# Patient Record
Sex: Female | Born: 1965 | Race: White | Hispanic: No | Marital: Married | Smoking: Never smoker
Health system: Southern US, Community
[De-identification: ages and names within clinical notes are randomized; demographics above are authoritative.]

## PROBLEM LIST (undated history)

## (undated) DIAGNOSIS — G43909 Migraine, unspecified, not intractable, without status migrainosus: Secondary | ICD-10-CM

## (undated) DIAGNOSIS — R569 Unspecified convulsions: Secondary | ICD-10-CM

## (undated) DIAGNOSIS — G40309 Generalized idiopathic epilepsy and epileptic syndromes, not intractable, without status epilepticus: Principal | ICD-10-CM

## (undated) HISTORY — DX: Migraine, unspecified, not intractable, without status migrainosus: G43.909

## (undated) HISTORY — DX: Generalized idiopathic epilepsy and epileptic syndromes, not intractable, without status epilepticus: G40.309

---
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
## 1999-05-11 ENCOUNTER — Other Ambulatory Visit: Admission: RE | Admit: 1999-05-11 | Discharge: 1999-05-11 | Payer: Self-pay | Admitting: Obstetrics & Gynecology

## 1999-05-13 ENCOUNTER — Ambulatory Visit (HOSPITAL_COMMUNITY): Admission: AD | Admit: 1999-05-13 | Discharge: 1999-05-13 | Payer: Self-pay | Admitting: Obstetrics & Gynecology

## 1999-10-12 ENCOUNTER — Other Ambulatory Visit: Admission: RE | Admit: 1999-10-12 | Discharge: 1999-10-12 | Payer: Self-pay | Admitting: Obstetrics & Gynecology

## 2000-01-03 ENCOUNTER — Inpatient Hospital Stay (HOSPITAL_COMMUNITY): Admission: AD | Admit: 2000-01-03 | Discharge: 2000-01-03 | Payer: Self-pay | Admitting: Obstetrics and Gynecology

## 2000-05-01 ENCOUNTER — Inpatient Hospital Stay (HOSPITAL_COMMUNITY): Admission: AD | Admit: 2000-05-01 | Discharge: 2000-05-03 | Payer: Self-pay | Admitting: Obstetrics & Gynecology

## 2000-06-06 ENCOUNTER — Other Ambulatory Visit: Admission: RE | Admit: 2000-06-06 | Discharge: 2000-06-06 | Payer: Self-pay | Admitting: Obstetrics & Gynecology

## 2001-10-03 ENCOUNTER — Other Ambulatory Visit: Admission: RE | Admit: 2001-10-03 | Discharge: 2001-10-03 | Payer: Self-pay | Admitting: Obstetrics & Gynecology

## 2003-01-23 ENCOUNTER — Emergency Department (HOSPITAL_COMMUNITY): Admission: EM | Admit: 2003-01-23 | Discharge: 2003-01-23 | Payer: Self-pay | Admitting: Emergency Medicine

## 2003-01-23 ENCOUNTER — Encounter: Payer: Self-pay | Admitting: Emergency Medicine

## 2003-09-17 ENCOUNTER — Other Ambulatory Visit: Admission: RE | Admit: 2003-09-17 | Discharge: 2003-09-17 | Payer: Self-pay | Admitting: Family Medicine

## 2010-11-29 HISTORY — PX: FOOT SURGERY: SHX648

## 2010-12-11 ENCOUNTER — Ambulatory Visit
Admission: RE | Admit: 2010-12-11 | Discharge: 2010-12-11 | Payer: Self-pay | Source: Home / Self Care | Attending: Orthopedic Surgery | Admitting: Orthopedic Surgery

## 2010-12-14 LAB — POCT HEMOGLOBIN-HEMACUE: Hemoglobin: 14.6 g/dL (ref 12.0–15.0)

## 2013-06-21 ENCOUNTER — Other Ambulatory Visit: Payer: Self-pay | Admitting: Neurology

## 2013-06-27 ENCOUNTER — Encounter: Payer: Self-pay | Admitting: Nurse Practitioner

## 2013-07-05 ENCOUNTER — Ambulatory Visit: Payer: Self-pay | Admitting: Nurse Practitioner

## 2013-07-31 ENCOUNTER — Other Ambulatory Visit: Payer: Self-pay | Admitting: Neurology

## 2013-08-08 ENCOUNTER — Other Ambulatory Visit: Payer: Self-pay | Admitting: Neurology

## 2013-09-26 ENCOUNTER — Telehealth: Payer: Self-pay | Admitting: *Deleted

## 2013-09-26 NOTE — Telephone Encounter (Signed)
Called patient to let her know here appt time was changed from 315 to 245. If the patient can not make this time she is call the office back and r/s.

## 2013-09-27 ENCOUNTER — Ambulatory Visit: Payer: Self-pay | Admitting: Nurse Practitioner

## 2013-10-26 ENCOUNTER — Emergency Department (HOSPITAL_COMMUNITY)
Admission: EM | Admit: 2013-10-26 | Discharge: 2013-10-26 | Disposition: A | Discharge status: Home / Self Care | Payer: BC Managed Care – PPO | Attending: Emergency Medicine | Admitting: Emergency Medicine

## 2013-10-26 ENCOUNTER — Encounter (HOSPITAL_COMMUNITY): Payer: Self-pay | Admitting: Emergency Medicine

## 2013-10-26 DIAGNOSIS — Y9389 Activity, other specified: Secondary | ICD-10-CM | POA: Insufficient documentation

## 2013-10-26 DIAGNOSIS — G40909 Epilepsy, unspecified, not intractable, without status epilepticus: Secondary | ICD-10-CM | POA: Insufficient documentation

## 2013-10-26 DIAGNOSIS — Z79899 Other long term (current) drug therapy: Secondary | ICD-10-CM | POA: Insufficient documentation

## 2013-10-26 DIAGNOSIS — Y9289 Other specified places as the place of occurrence of the external cause: Secondary | ICD-10-CM | POA: Insufficient documentation

## 2013-10-26 DIAGNOSIS — W503XXA Accidental bite by another person, initial encounter: Secondary | ICD-10-CM | POA: Insufficient documentation

## 2013-10-26 DIAGNOSIS — G43909 Migraine, unspecified, not intractable, without status migrainosus: Secondary | ICD-10-CM | POA: Insufficient documentation

## 2013-10-26 DIAGNOSIS — R569 Unspecified convulsions: Secondary | ICD-10-CM

## 2013-10-26 DIAGNOSIS — Z880 Allergy status to penicillin: Secondary | ICD-10-CM | POA: Insufficient documentation

## 2013-10-26 DIAGNOSIS — S0993XA Unspecified injury of face, initial encounter: Secondary | ICD-10-CM | POA: Insufficient documentation

## 2013-10-26 HISTORY — DX: Unspecified convulsions: R56.9

## 2013-10-26 LAB — COMPREHENSIVE METABOLIC PANEL
ALT: 10 U/L (ref 0–35)
AST: 14 U/L (ref 0–37)
Albumin: 3.8 g/dL (ref 3.5–5.2)
Alkaline Phosphatase: 77 U/L (ref 39–117)
BUN: 13 mg/dL (ref 6–23)
CO2: 29 mEq/L (ref 19–32)
Calcium: 8.7 mg/dL (ref 8.4–10.5)
Chloride: 102 mEq/L (ref 96–112)
Creatinine, Ser: 0.78 mg/dL (ref 0.50–1.10)
GFR calc Af Amer: >90 mL/min (ref >90)
GFR calc non Af Amer: >90 mL/min (ref >90)
Glucose, Bld: 103 mg/dL — ABNORMAL HIGH (ref 70–99)
Potassium: 4.5 mEq/L (ref 3.5–5.1)
Sodium: 138 mEq/L (ref 135–145)
Total Bilirubin: 0.2 mg/dL — ABNORMAL LOW (ref 0.3–1.2)
Total Protein: 6.6 g/dL (ref 6.0–8.3)

## 2013-10-26 LAB — CARBAMAZEPINE LEVEL, TOTAL: Carbamazepine Lvl: 3.8 ug/mL — ABNORMAL LOW (ref 4.0–12.0)

## 2013-10-26 MED ORDER — CARBAMAZEPINE ER 200 MG PO CP12: 400.0000 mg | ORAL_CAPSULE | Freq: Two times a day (BID) | ORAL | Status: DC

## 2013-10-26 MED ORDER — SODIUM CHLORIDE 0.9 % IV SOLN
INTRAVENOUS | Status: DC
  Administered 2013-10-26: 13:00:00 via INTRAVENOUS

## 2013-10-26 NOTE — ED Notes (Signed)
Per family, states history of seizure-had seizure around 1145 lasted about 4-5 minutes-was seen by EMS-family member felt he could transport her-las seizure was 10 years ago

## 2013-10-26 NOTE — ED Provider Notes (Signed)
CSN: [id number]     Arrival date & time 10/26/13  1214 History   First MD Initiated Contact with Patient 10/26/13 1238     Chief Complaint  Patient presents with  . Seizures   (Consider location/radiation/quality/duration/timing/severity/associated sxs/prior Treatment) Patient is a 47 y.o. female presenting with seizures. The history is provided by the patient.  Seizures  patient here after having a seizure witnessed by her husband was lasted for approximately for 5 minutes. Described as generalized tonic-clonic activity with a postictal state. She does have tongue injury denies any loss of bladder function. Last seizure was approximately 10 years ago she denies any recent illnesses. No headache or fever or neck pain. Has been compliant with her Tegretol. States however that she had decreased sleep last night because she went to the black Thursday shopping at [employer]  Past Medical History  Diagnosis Date  . Migraine   . Seizures    Past Surgical History  Procedure Laterality Date  . Eye surgery      age 26  . Foot surgery      11/2010   Family History  Problem Relation Age of Onset  . Cancer Mother     breast   . Cancer - Prostate Maternal Grandfather   . Cancer Paternal Grandfather     bladder   . Stroke      one grandmother   . High blood pressure      one grandmother    History  Substance Use Topics  . Smoking status: Never Smoker   . Smokeless tobacco: Never Used  . Alcohol Use: 1 - 1.5 oz/week    2-3 drink(s) per week     Comment: mixed drinks weekly    OB History   Grav Para Term Preterm Abortions TAB SAB Ect Mult Living                 Review of Systems  Neurological: Positive for seizures.  All other systems reviewed and are negative.    Allergies  Penicillins  Home Medications   Current Outpatient Rx  Name  Route  Sig  Dispense  Refill  . carbamazepine (CARBATROL) 200 MG 12 hr capsule      TAKE 2 CAPSULES BY MOUTH TWICE A DAY   120 capsule  1   . SUMAtriptan (IMITREX) 100 MG tablet   Oral   Take 100 mg by mouth every 2 (two) hours as needed for migraine (onset of migraine).          BP 108/64  Pulse 60  Temp(Src) 98.2 F (36.8 C) (Oral)  Resp 16  SpO2 95%  LMP 08/26/2013 Physical Exam  Nursing note and vitals reviewed. Constitutional: She is oriented to person, place, and time. She appears well-developed and well-nourished.  Non-toxic appearance. No distress.  HENT:  Head: Normocephalic and atraumatic.  Eyes: Conjunctivae, EOM and lids are normal. Pupils are equal, round, and reactive to light.  Neck: Normal range of motion. Neck supple. No tracheal deviation present. No mass present.  Cardiovascular: Normal rate, regular rhythm and normal heart sounds.  Exam reveals no gallop.   No murmur heard. Pulmonary/Chest: Effort normal and breath sounds normal. No stridor. No respiratory distress. She has no decreased breath sounds. She has no wheezes. She has no rhonchi. She has no rales.  Abdominal: Soft. Normal appearance and bowel sounds are normal. She exhibits no distension. There is no tenderness. There is no rebound and no CVA tenderness.  Musculoskeletal: Normal range of  motion. She exhibits no edema and no tenderness.  Neurological: She is alert and oriented to person, place, and time. She has normal strength. No cranial nerve deficit or sensory deficit. GCS eye subscore is 4. GCS verbal subscore is 5. GCS motor subscore is 6.  Skin: Skin is warm and dry. No abrasion and no rash noted.  Psychiatric: She has a normal mood and affect. Her speech is normal and behavior is normal.    ED Course  Procedures (including critical care time) Labs Review Labs Reviewed  CARBAMAZEPINE LEVEL, TOTAL  COMPREHENSIVE METABOLIC PANEL   Imaging Review No results found.  EKG Interpretation   None       MDM  No diagnosis found. Patient instructed to take an extra dose of Tegretol when she gets home. i think that the  siezure was from lack of sleep    Toy Baker, MD 10/26/13 1513

## 2013-10-26 NOTE — ED Notes (Signed)
Is currently alert, oriented x 4

## 2013-10-26 NOTE — ED Notes (Signed)
Reports grand mal seizure w/o aura, no tongue injury, no incontinence,  takes med regularly, denied HA, Visual difficulty, sob, pain, or nausea

## 2013-10-29 ENCOUNTER — Telehealth: Payer: Self-pay | Admitting: Neurology

## 2013-10-30 NOTE — Telephone Encounter (Signed)
Called patient to sched appt but  was unable to reach at both numbers

## 2013-10-30 NOTE — Telephone Encounter (Signed)
Patient called back and sched appt 11/01/13

## 2013-11-01 ENCOUNTER — Encounter: Payer: Self-pay | Admitting: Neurology

## 2013-11-01 ENCOUNTER — Ambulatory Visit (INDEPENDENT_AMBULATORY_CARE_PROVIDER_SITE_OTHER): Payer: BC Managed Care – PPO | Admitting: Neurology

## 2013-11-01 VITALS — BP 121/80 | HR 66 | Ht 71.0 in | Wt 173.0 lb

## 2013-11-01 DIAGNOSIS — G40309 Generalized idiopathic epilepsy and epileptic syndromes, not intractable, without status epilepticus: Secondary | ICD-10-CM

## 2013-11-01 HISTORY — DX: Generalized idiopathic epilepsy and epileptic syndromes, not intractable, without status epilepticus: G40.309

## 2013-11-01 MED ORDER — CARBAMAZEPINE ER 200 MG PO CP12: ORAL_CAPSULE | ORAL | Status: DC

## 2013-11-01 MED ORDER — FOLIC ACID 1 MG PO TABS: 1.0000 mg | ORAL_TABLET | Freq: Every day | ORAL | Status: DC

## 2013-11-01 NOTE — Patient Instructions (Signed)
Seizure, Adult A seizure is abnormal electrical activity in the brain. Seizures can cause a change in attention or behavior (altered mental status). Seizures often involve uncontrollable shaking (convulsions). Seizures usually last from 30 seconds to 2 minutes. Epilepsy is a brain disorder in which a patient has repeated seizures over time. CAUSES  There are many different problems that can cause seizures. In some cases, no cause is found. Common causes of seizures include:  Head injuries.  Brain tumors.  Infections.  Imbalance of chemicals in the blood.  Kidney failure or liver failure.  Heart disease.  Drug abuse.  Stroke.  Withdrawal from certain drugs or alcohol.  Birth defects.  Malfunction of a neurosurgical device placed in the brain. SYMPTOMS  Symptoms vary depending on the part of the brain that is involved. Right before a seizure, you may have a warning (aura) that a seizure is about to occur. An aura may include the following symptoms:   Fear or anxiety.  Nausea.  Feeling like the room is spinning (vertigo).  Vision changes, such as seeing flashing lights or spots. Common symptoms during a seizure include:  Convulsions.  Drooling.  Rapid eye movements.  Grunting.  Loss of bladder and bowel control.  Bitter taste in the mouth. After a seizure, you may feel confused and sleepy. You may also have an injury resulting from convulsions during the seizure. DIAGNOSIS  Your caregiver will perform a physical exam and run some tests to determine the type and cause of your seizure. These tests may include:  Blood tests.  A lumbar puncture test. In this test, a small amount of fluid is removed from the spine and examined.  Electrocardiography (ECG). This test records the electrical activity in your heart.  Imaging tests, such as computed tomography (CT) scans or magnetic resonance imaging (MRI).  Electroencephalography (EEG). This test records the electrical  activity in your brain. TREATMENT  Seizures usually stop on their own. Treatment will depend on the cause of your seizure. In some cases, medicine may be given to prevent future seizures. HOME CARE INSTRUCTIONS   If you are given medicines, take them exactly as prescribed by your caregiver.  Keep all follow-up appointments as directed by your caregiver.  Do not swim or drive until your caregiver says it is okay.  Teach friends and family what to do if you have a seizure. They should:  Lay you on the ground to prevent a fall.  Put a cushion under your head.  Loosen any tight clothing around your neck.  Turn you on your side. If vomiting occurs, this helps keep your airway clear.  Stay with you until you recover. SEEK IMMEDIATE MEDICAL CARE IF:  The seizure lasts longer than 2 to 5 minutes.  The seizure is severe or the person does not wake up after the seizure.  The person has altered mental status. Drive the person to the emergency department or call your local emergency services (911 in [place] MAKE SURE YOU:  Understand these instructions.  Will watch your condition.  Will get help right away if you are not doing well or get worse. Document Released: 11/12/2000 Document Revised: 02/07/2012 Document Reviewed: 11/03/2011 [employer] Patient Information 2014 [employer].  

## 2013-11-01 NOTE — Progress Notes (Signed)
Reason for visit: Seizures  Amber Sanchez is an 47 y.o. female  History of present illness:  Amber Sanchez is a 47 year old left-handed white female with a history of seizures. The patient indicates that she first had a seizure around age 36. The patient has done quite well over time with the seizures, with the last seizure occurring about 10 years ago. The patient has been on a stable dose of Carbatrol taking 400 mg twice daily. The patient has been tolerating the medication well. The patient indicates that she suffered a seizure recently on 10/26/2013. The patient had been up late the night before shopping. The patient got only about 4 or 5 hours of sleep. The patient had no warning with the seizure, and had a generalized convulsive event associated with tongue biting. The patient did not injure herself with the seizure. The patient did not miss any doses of medications, and the blood levels of carbamazepine in the emergency room were slightly subtherapeutic at 3.8. The medical records from the emergency room and the lab reports were reviewed online. The patient comes to this office for an evaluation.  Past Medical History  Diagnosis Date  . Migraine   . Seizures   . 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 Eye surgery      age 72  . Foot surgery      11/2010 left foot stress fracture    Family History  Problem Relation Age of Onset  . Cancer Mother     breast   . Cancer - Prostate Maternal Grandfather   . Cancer Paternal Grandfather     bladder   . Stroke      one grandmother   . High blood pressure      one grandmother   . Seizures Neg Hx     Social history:  reports that she has never smoked. She has never used smokeless tobacco. She reports that she drinks about 1.0 ounces of alcohol per week. She reports that she does not use illicit drugs.    Allergies  Allergen  Reactions  . Penicillins     childhood    Medications:  Current Outpatient Prescriptions on File Prior to Visit  Medication Sig Dispense Refill  . SUMAtriptan (IMITREX) 100 MG tablet Take 100 mg by mouth every 2 (two) hours as needed for migraine (onset of migraine).       No current facility-administered medications on file prior to visit.    ROS:  Out of a complete 14 system review of symptoms, the patient complains only of the following symptoms, and all other reviewed systems are negative.  Headache Seizure Anxiety, not enough sleep  Blood pressure 121/80, pulse 66, height 5\' 11"  (1.803 m), weight 173 lb (78.472 kg), last menstrual period 08/26/2013.  Physical Exam  General: The patient is alert and cooperative at the time of the examination.  Skin: No significant peripheral edema is noted.   Neurologic Exam  Mental status: The patient is oriented x 3.  Cranial nerves: Facial symmetry is present. Speech is normal, no aphasia or dysarthria is noted. Extraocular movements are full. Visual fields are full.  Motor: The patient has good strength in all 4 extremities.  Sensory examination: Soft touch sensation on the face, arms, and legs is symmetric.  Coordination: The patient has good finger-nose-finger and heel-to-shin bilaterally.  Gait and station: The patient has a normal gait.  Tandem gait is normal. Romberg is negative. No drift is seen.  Reflexes: Deep tendon reflexes are symmetric.   Assessment/Plan:  1. History of seizure disorder with recent recurrence  2. History of migraine headache  The patient has been doing quite well with the seizures, although her carbamazepine level was subtherapeutic in the emergency room. The patient may have seizures secondary to sleep deprivation. The patient will have her carbamazepine dose readjusted, taking 400 mg in the morning, and 600 mg in the evening. The patient will contact me if she has any further problems. I have  instructed her that she is not to drive for at least 6 months. The patient will followup through this office in 6 months. The patient will be placed on folic acid, 1 mg daily. The patient will go on vitamin D supplementation, 1000 international units daily.  Amber Palau MD 11/01/2013 9:20 PM  [facility] [address] [place], [place] [address]  Phone [phone] Fax [phone]

## 2013-11-06 ENCOUNTER — Telehealth: Payer: Self-pay | Admitting: *Deleted

## 2013-11-06 NOTE — Telephone Encounter (Signed)
Patient was seen by Dr. Anne Hahn and has a follow-up scheduled with cm in 6 months.

## 2013-11-06 NOTE — Telephone Encounter (Signed)
Message copied by Ardeth Sportsman on Tue Nov 06, 2013  1:25 PM ------      Message from: Beverely Low      Created: Thu Sep 27, 2013 10:59 AM      Regarding: RE: appt        How about 11/4 next Tuesday, my last patient is a 2:30 you can block the 3 and I will see her at 3:30      ----- Message -----         From: Chester Holstein         Sent: 09/27/2013   8:30 AM           To: Nilda Riggs, NP      Subject: appt                                                     This patient that was scheduled for 330 today but we moved her up to 3 pm can not make it. 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 She wants to r/s her appt for today now because she has made other arrangements with her babysitter. Is there any other day that you would be willing to see her on a Mon or Thurs at 330. I will call her back with some options. Please let me know, thanks.        ------

## 2014-03-28 ENCOUNTER — Other Ambulatory Visit: Payer: Self-pay | Admitting: Gastroenterology

## 2014-03-28 DIAGNOSIS — R1032 Left lower quadrant pain: Secondary | ICD-10-CM

## 2014-04-04 ENCOUNTER — Other Ambulatory Visit: Payer: BC Managed Care – PPO

## 2014-04-09 ENCOUNTER — Encounter (HOSPITAL_COMMUNITY): Payer: Self-pay | Admitting: Emergency Medicine

## 2014-04-09 ENCOUNTER — Emergency Department (HOSPITAL_COMMUNITY): Payer: BC Managed Care – PPO

## 2014-04-09 ENCOUNTER — Emergency Department (HOSPITAL_COMMUNITY)
Admission: EM | Admit: 2014-04-09 | Discharge: 2014-04-10 | Disposition: A | Discharge status: Home / Self Care | Payer: BC Managed Care – PPO | Attending: Emergency Medicine | Admitting: Emergency Medicine

## 2014-04-09 DIAGNOSIS — R350 Frequency of micturition: Secondary | ICD-10-CM | POA: Insufficient documentation

## 2014-04-09 DIAGNOSIS — G43909 Migraine, unspecified, not intractable, without status migrainosus: Secondary | ICD-10-CM | POA: Insufficient documentation

## 2014-04-09 DIAGNOSIS — G40909 Epilepsy, unspecified, not intractable, without status epilepticus: Secondary | ICD-10-CM | POA: Insufficient documentation

## 2014-04-09 DIAGNOSIS — Z880 Allergy status to penicillin: Secondary | ICD-10-CM | POA: Insufficient documentation

## 2014-04-09 DIAGNOSIS — N83201 Unspecified ovarian cyst, right side: Secondary | ICD-10-CM

## 2014-04-09 DIAGNOSIS — N83209 Unspecified ovarian cyst, unspecified side: Secondary | ICD-10-CM | POA: Insufficient documentation

## 2014-04-09 LAB — COMPREHENSIVE METABOLIC PANEL
ALT: 9 U/L (ref 0–35)
AST: 14 U/L (ref 0–37)
Albumin: 3.6 g/dL (ref 3.5–5.2)
Alkaline Phosphatase: 72 U/L (ref 39–117)
BUN: 16 mg/dL (ref 6–23)
CO2: 28 mEq/L (ref 19–32)
Calcium: 8.7 mg/dL (ref 8.4–10.5)
Chloride: 102 mEq/L (ref 96–112)
Creatinine, Ser: 0.66 mg/dL (ref 0.50–1.10)
GFR calc Af Amer: >90 mL/min (ref >90)
GFR calc non Af Amer: >90 mL/min (ref >90)
Glucose, Bld: 108 mg/dL — ABNORMAL HIGH (ref 70–99)
Potassium: 3.8 mEq/L (ref 3.7–5.3)
Sodium: 140 mEq/L (ref 137–147)
Total Bilirubin: <0.2 mg/dL — ABNORMAL LOW (ref 0.3–1.2)
Total Protein: 6.5 g/dL (ref 6.0–8.3)

## 2014-04-09 LAB — CBC WITH DIFFERENTIAL/PLATELET
Basophils Absolute: 0 10*3/uL (ref 0.0–0.1)
Basophils Relative: 1 % (ref 0–1)
Eosinophils Absolute: 0.1 10*3/uL (ref 0.0–0.7)
Eosinophils Relative: 2 % (ref 0–5)
HCT: 38.6 % (ref 36.0–46.0)
Hemoglobin: 13 g/dL (ref 12.0–15.0)
Lymphocytes Relative: 33 % (ref 12–46)
Lymphs Abs: 2.9 10*3/uL (ref 0.7–4.0)
MCH: 30.7 pg (ref 26.0–34.0)
MCHC: 33.7 g/dL (ref 30.0–36.0)
MCV: 91.3 fL (ref 78.0–100.0)
Monocytes Absolute: 0.8 10*3/uL (ref 0.1–1.0)
Monocytes Relative: 9 % (ref 3–12)
Neutro Abs: 4.9 10*3/uL (ref 1.7–7.7)
Neutrophils Relative %: 56 % (ref 43–77)
Platelets: 191 10*3/uL (ref 150–400)
RBC: 4.23 MIL/uL (ref 3.87–5.11)
RDW: 12.6 % (ref 11.5–15.5)
WBC: 8.8 10*3/uL (ref 4.0–10.5)

## 2014-04-09 LAB — URINALYSIS, ROUTINE W REFLEX MICROSCOPIC
Bilirubin Urine: NEGATIVE
Glucose, UA: NEGATIVE mg/dL
Hgb urine dipstick: NEGATIVE
Ketones, ur: NEGATIVE mg/dL
Leukocytes, UA: NEGATIVE
Nitrite: NEGATIVE
Protein, ur: NEGATIVE mg/dL
Specific Gravity, Urine: 1.027 (ref 1.005–1.030)
Urobilinogen, UA: 0.2 mg/dL (ref 0.0–1.0)
pH: 5.5 (ref 5.0–8.0)

## 2014-04-09 LAB — LIPASE, BLOOD: Lipase: 37 U/L (ref 11–59)

## 2014-04-09 MED ORDER — IOHEXOL 300 MG/ML  SOLN
100.0000 mL | Freq: Once | INTRAMUSCULAR | Status: AC | PRN
  Administered 2014-04-09: 100 mL via INTRAVENOUS

## 2014-04-09 MED ORDER — IOHEXOL 300 MG/ML  SOLN
50.0000 mL | Freq: Once | INTRAMUSCULAR | Status: AC | PRN
  Administered 2014-04-09: 50 mL via ORAL

## 2014-04-09 NOTE — ED Notes (Signed)
Patient transported to CT 

## 2014-04-09 NOTE — ED Notes (Signed)
Pt reports low abd pain denies n/v/d.  Pt reports urinary frequency and possibly urinary retention.

## 2014-04-10 MED ORDER — CIPROFLOXACIN HCL 500 MG PO TABS: 500.0000 mg | ORAL_TABLET | Freq: Two times a day (BID) | ORAL | Status: DC

## 2014-04-10 MED ORDER — OXYCODONE-ACETAMINOPHEN 5-325 MG PO TABS
1.0000 | ORAL_TABLET | Freq: Once | ORAL | Status: AC
  Administered 2014-04-10: 1 via ORAL
  Filled 2014-04-10: qty 1

## 2014-04-10 MED ORDER — HYDROCODONE-ACETAMINOPHEN 5-325 MG PO TABS: 1.0000 | ORAL_TABLET | Freq: Four times a day (QID) | ORAL | Status: DC | PRN

## 2014-04-10 NOTE — Discharge Instructions (Signed)
Return here as needed.  Followup with your primary care Dr. for recheck °

## 2014-04-10 NOTE — ED Provider Notes (Signed)
CSN: [id number]     Arrival date & time 04/09/14  2102 History   First MD Initiated Contact with Patient 04/09/14 2209     Chief Complaint  Patient presents with  . Abdominal Pain  . Urinary Frequency     (Consider location/radiation/quality/duration/timing/severity/associated sxs/prior Treatment) HPI Patient presents to the emergency department with bilateral lower abdominal pain.  The patient, states, that she's had some urinary frequency.  Patient, states, that nothing seems to make her condition, better or worse.  The patient denies chest pain, shortness of breath, nausea, vomiting, diarrhea, back pain, fever, weakness, dizziness, or syncope.  Patient did not take any medications prior to arrival.  Patient, states, that her symptoms have been constant since earlier today Past Medical History  Diagnosis Date  . Migraine   . Seizures   . 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 Eye surgery      age 89  . Foot surgery      11/2010 left foot stress fracture   Family History  Problem Relation Age of Onset  . Cancer Mother     breast   . Cancer - Prostate Maternal Grandfather   . Cancer Paternal Grandfather     bladder   . Stroke      one grandmother   . High blood pressure      one grandmother   . Seizures Neg Hx    History  Substance Use Topics  . Smoking status: Never Smoker   . Smokeless tobacco: Never Used  . Alcohol Use: 1.0 - 1.5 oz/week    2-3 drink(s) per week     Comment: mixed drinks weekly    OB History   Grav Para Term Preterm Abortions TAB SAB Ect Mult Living                 Review of Systems  All other systems negative except as documented in the HPI. All pertinent positives and negatives as reviewed in the HPI.  Allergies  Penicillins  Home Medications   Prior to Admission medications   Medication Sig Start Date End Date Taking? Authorizing Provider    carbamazepine (CARBATROL) 200 MG 12 hr capsule Two capsules in the morning and three capsules in the evening 11/01/13  Yes York Spanielharles K Willis, MD  cholecalciferol (VITAMIN D) 1000 UNITS tablet Take 1,000 Units by mouth daily.   Yes Historical Provider, MD  ibuprofen (ADVIL,MOTRIN) 200 MG tablet Take 400 mg by mouth every 6 (six) hours as needed for moderate pain.   Yes Historical Provider, MD  SUMAtriptan (IMITREX) 100 MG tablet Take 100 mg by mouth every 2 (two) hours as needed for migraine (onset of migraine).    Historical Provider, MD   BP 105/56  Pulse 61  Temp(Src) 97.5 F (36.4 C) (Oral)  Resp 16  SpO2 95% Physical Exam  Nursing note reviewed. Constitutional: She is oriented to person, place, and time. She appears well-developed and well-nourished. No distress.  HENT:  Head: Normocephalic and atraumatic.  Mouth/Throat: Oropharynx is clear and moist.  Eyes: Pupils are equal, round, and reactive to light.  Neck: Normal range of motion. Neck supple.  Cardiovascular: Normal rate, regular rhythm and normal heart sounds.  Exam reveals no gallop and no friction rub.   No murmur heard. Pulmonary/Chest: Effort normal and breath sounds normal.  Abdominal: Soft. Normal appearance and bowel sounds are normal. She exhibits no distension. There is  tenderness. There is no rebound and no guarding.    Neurological: She is alert and oriented to person, place, and time.  Skin: Skin is warm.    ED Course  Procedures (including critical care time) Labs Review Labs Reviewed  URINALYSIS, ROUTINE W REFLEX MICROSCOPIC - Abnormal; Notable for the following:    APPearance CLOUDY (*)    All other components within normal limits  COMPREHENSIVE METABOLIC PANEL - Abnormal; Notable for the following:    Glucose, Bld 108 (*)    Total Bilirubin <0.2 (*)    All other components within normal limits  CBC WITH DIFFERENTIAL  LIPASE, BLOOD    Imaging Review Ct Abdomen Pelvis W Contrast  04/10/2014    CLINICAL DATA:  Low abdominal pain and pressure for 2 days. Urinary frequency.  EXAM: CT ABDOMEN AND PELVIS WITH CONTRAST  TECHNIQUE: Multidetector CT imaging of the abdomen and pelvis was performed using the standard protocol following bolus administration of intravenous contrast.  CONTRAST:  50mL OMNIPAQUE IOHEXOL 300 MG/ML SOLN, 100mL OMNIPAQUE IOHEXOL 300 MG/ML SOLN  COMPARISON:  None.  FINDINGS: Mild dependent changes in the lung bases.  The liver, spleen, gallbladder, pancreas, adrenal glands, kidneys, abdominal aorta, inferior vena cava, and retroperitoneal lymph nodes are unremarkable. The stomach and small bowel are not abnormally distended. Stool-filled colon without distention. No free air or free fluid in the abdomen.  Pelvis: The appendix is normal. Uterus is anteverted without enlargement. Left ovary is normal. Cystic structure on the right ovary measuring 5.1 cm maximal diameter. Followup ultrasound in 6-12 weeks is recommended. No free or loculated pelvic fluid collections. Bladder wall is not thickened. No significant lymphadenopathy in the pelvis. No evidence of diverticulitis. Mild degenerative changes in spine. No destructive bone lesions appreciated.  IMPRESSION: Indeterminate cystic structure in the right ovary measuring 5.1 cm maximal diameter. Followup ultrasound in 6-12 weeks is recommended. No acute process otherwise identified in the abdomen or pelvis.   Electronically Signed   By: Burman NievesWilliam  Stevens M.D.   On: 04/10/2014 00:19    Patient will be referred back to her primary care doctor.  There is a right ovarian cystic type area.  The patient is advised to followup here for any worsening in her condition.   Carlyle Dollyhristopher W Hyder Deman, PA-C 04/10/14 [phone]

## 2014-04-11 NOTE — ED Provider Notes (Signed)
Medical screening examination/treatment/procedure(s) were conducted as a shared visit with non-physician practitioner(s) and myself.  I personally evaluated the patient during the encounter.   EKG Interpretation None     No acute abdomen. CT abdomen pelvis reveals a 5.1 cm cystic structure on right ovary. Patient has gynecological followup  Donnetta HutchingBrian Anissia Wessells, MD 04/11/14 [phone]

## 2014-05-06 ENCOUNTER — Ambulatory Visit: Payer: BC Managed Care – PPO | Admitting: Nurse Practitioner

## 2014-05-06 ENCOUNTER — Telehealth: Payer: Self-pay | Admitting: Nurse Practitioner

## 2014-05-06 NOTE — Telephone Encounter (Signed)
No show for appt. 

## 2014-05-19 ENCOUNTER — Emergency Department (HOSPITAL_COMMUNITY)
Admission: EM | Admit: 2014-05-19 | Discharge: 2014-05-19 | Disposition: A | Discharge status: Home / Self Care | Payer: BC Managed Care – PPO | Attending: Emergency Medicine | Admitting: Emergency Medicine

## 2014-05-19 ENCOUNTER — Emergency Department (HOSPITAL_COMMUNITY): Payer: BC Managed Care – PPO

## 2014-05-19 ENCOUNTER — Encounter (HOSPITAL_COMMUNITY): Payer: Self-pay | Admitting: Emergency Medicine

## 2014-05-19 DIAGNOSIS — Z79899 Other long term (current) drug therapy: Secondary | ICD-10-CM | POA: Insufficient documentation

## 2014-05-19 DIAGNOSIS — R569 Unspecified convulsions: Secondary | ICD-10-CM | POA: Insufficient documentation

## 2014-05-19 DIAGNOSIS — K529 Noninfective gastroenteritis and colitis, unspecified: Secondary | ICD-10-CM

## 2014-05-19 DIAGNOSIS — K5190 Ulcerative colitis, unspecified, without complications: Secondary | ICD-10-CM | POA: Insufficient documentation

## 2014-05-19 DIAGNOSIS — Z880 Allergy status to penicillin: Secondary | ICD-10-CM | POA: Insufficient documentation

## 2014-05-19 DIAGNOSIS — Z8669 Personal history of other diseases of the nervous system and sense organs: Secondary | ICD-10-CM | POA: Insufficient documentation

## 2014-05-19 DIAGNOSIS — G40309 Generalized idiopathic epilepsy and epileptic syndromes, not intractable, without status epilepticus: Secondary | ICD-10-CM | POA: Insufficient documentation

## 2014-05-19 LAB — URINE MICROSCOPIC-ADD ON

## 2014-05-19 LAB — URINALYSIS, ROUTINE W REFLEX MICROSCOPIC
Bilirubin Urine: NEGATIVE
Glucose, UA: NEGATIVE mg/dL
Ketones, ur: NEGATIVE mg/dL
Leukocytes, UA: NEGATIVE
Nitrite: NEGATIVE
Protein, ur: NEGATIVE mg/dL
Specific Gravity, Urine: 1.025 (ref 1.005–1.030)
Urobilinogen, UA: 0.2 mg/dL (ref 0.0–1.0)
pH: 5.5 (ref 5.0–8.0)

## 2014-05-19 LAB — POC URINE PREG, ED: Preg Test, Ur: NEGATIVE

## 2014-05-19 MED ORDER — SODIUM CHLORIDE 0.9 % IV BOLUS (SEPSIS)
500.0000 mL | Freq: Once | INTRAVENOUS | Status: AC
  Administered 2014-05-19: 500 mL via INTRAVENOUS

## 2014-05-19 MED ORDER — CIPROFLOXACIN HCL 500 MG PO TABS: 500.0000 mg | ORAL_TABLET | Freq: Two times a day (BID) | ORAL | Status: DC

## 2014-05-19 MED ORDER — ONDANSETRON 4 MG PO TBDP
4.0000 mg | ORAL_TABLET | Freq: Once | ORAL | Status: AC
  Administered 2014-05-19: 4 mg via ORAL
  Filled 2014-05-19: qty 1

## 2014-05-19 MED ORDER — IOHEXOL 300 MG/ML  SOLN
100.0000 mL | Freq: Once | INTRAMUSCULAR | Status: AC | PRN
  Administered 2014-05-19: 100 mL via INTRAVENOUS

## 2014-05-19 MED ORDER — METRONIDAZOLE 500 MG PO TABS: 500.0000 mg | ORAL_TABLET | Freq: Two times a day (BID) | ORAL | Status: DC

## 2014-05-19 MED ORDER — HYDROCODONE-ACETAMINOPHEN 5-325 MG PO TABS: 1.0000 | ORAL_TABLET | Freq: Four times a day (QID) | ORAL | Status: DC | PRN

## 2014-05-19 MED ORDER — HYDROCODONE-ACETAMINOPHEN 5-325 MG PO TABS
2.0000 | ORAL_TABLET | Freq: Once | ORAL | Status: AC
  Administered 2014-05-19: 2 via ORAL
  Filled 2014-05-19: qty 2

## 2014-05-19 NOTE — ED Notes (Signed)
Patient transported to CT 

## 2014-05-19 NOTE — ED Provider Notes (Signed)
CSN: [id number]     Arrival date & time 05/19/14  1546 History   First MD Initiated Contact with Patient 05/19/14 1609     Chief Complaint  Patient presents with  . Abdominal Pain     (Consider location/radiation/quality/duration/timing/severity/associated sxs/prior Treatment) Patient is a 48 y.o. female presenting with abdominal pain. The history is provided by the patient.  Abdominal Pain Pain location:  RLQ and LLQ Pain quality: sharp   Pain radiates to:  Does not radiate Pain severity:  Moderate Onset quality:  Sudden Duration:  12 hours Timing:  Constant Progression:  Worsening Chronicity:  Recurrent Context: not sick contacts and not suspicious food intake   Associated symptoms: diarrhea, nausea and vomiting   Associated symptoms: no chills, no cough, no fever and no shortness of breath     Past Medical History  Diagnosis Date  . Migraine   . Seizures   . 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 Eye surgery      age 64  . Foot surgery      11/2010 left foot stress fracture   Family History  Problem Relation Age of Onset  . Cancer Mother     breast   . Cancer - Prostate Maternal Grandfather   . Cancer Paternal Grandfather     bladder   . Stroke      one grandmother   . High blood pressure      one grandmother   . Seizures Neg Hx    History  Substance Use Topics  . Smoking status: Never Smoker   . Smokeless tobacco: Never Used  . Alcohol Use: 1.0 - 1.5 oz/week    2-3 drink(s) per week     Comment: mixed drinks weekly    OB History   Grav Para Term Preterm Abortions TAB SAB Ect Mult Living                 Review of Systems  Constitutional: Negative for fever and chills.  Respiratory: Negative for cough and shortness of breath.   Gastrointestinal: Positive for nausea, vomiting, abdominal pain and diarrhea.  All other systems reviewed and are  negative.     Allergies  Penicillins  Home Medications   Prior to Admission medications   Medication Sig Start Date End Date Taking? Authorizing Provider  carbamazepine (CARBATROL) 200 MG 12 hr capsule Two capsules in the morning and three capsules in the evening 11/01/13  Yes York Spaniel, MD  cholecalciferol (VITAMIN D) 1000 UNITS tablet Take 1,000 Units by mouth daily.   Yes Historical Provider, MD  HYDROcodone-acetaminophen (NORCO/VICODIN) 5-325 MG per tablet Take 1 tablet by mouth every 6 (six) hours as needed for moderate pain. 04/10/14  Yes Jamesetta Orleans Lawyer, PA-C  ibuprofen (ADVIL,MOTRIN) 200 MG tablet Take 400 mg by mouth every 6 (six) hours as needed for moderate pain.   Yes Historical Provider, MD  SUMAtriptan (IMITREX) 100 MG tablet Take 100 mg by mouth every 2 (two) hours as needed for migraine (onset of migraine).   Yes Historical Provider, MD   BP 128/73  Pulse 66  Temp(Src) 98.1 F (36.7 C) (Oral)  Resp 16  SpO2 99% Physical Exam  Nursing note and vitals reviewed. Constitutional: She is oriented to person, place, and time. She appears well-developed and well-nourished. No distress.  HENT:  Head: Normocephalic and atraumatic.  Eyes: EOM are normal. Pupils are equal, round, and reactive  to light.  Neck: Normal range of motion. Neck supple.  Cardiovascular: Normal rate and regular rhythm.  Exam reveals no friction rub.   No murmur heard. Pulmonary/Chest: Effort normal and breath sounds normal. No respiratory distress. She has no wheezes. She has no rales.  Abdominal: Soft. She exhibits no distension. There is tenderness (lower, bilateral lower quadrant). There is no rebound.  Musculoskeletal: Normal range of motion. She exhibits no edema.  Neurological: She is alert and oriented to person, place, and time. No cranial nerve deficit. She exhibits normal muscle tone. Coordination normal.  Skin: No rash noted. She is not diaphoretic.    ED Course  Procedures  (including critical care time) Labs Review Labs Reviewed  URINALYSIS, ROUTINE W REFLEX MICROSCOPIC - Abnormal; Notable for the following:    Hgb urine dipstick MODERATE (*)    All other components within normal limits  URINE MICROSCOPIC-ADD ON  POC URINE PREG, ED    Imaging Review [place] Transvaginal Non-ob  05/19/2014   CLINICAL DATA:  Abdominal pain. History of ovarian cysts. Evaluate for potential ovarian torsion.  EXAM: TRANSABDOMINAL AND TRANSVAGINAL ULTRASOUND OF PELVIS  TECHNIQUE: Both transabdominal and transvaginal ultrasound examinations of the pelvis were performed. Transabdominal technique was performed for global imaging of the pelvis including uterus, ovaries, adnexal regions, and pelvic cul-de-sac. It was necessary to proceed with endovaginal exam following the transabdominal exam to visualize the endometrium and bilateral adnexal regions.  COMPARISON:  No priors.  CT of the abdomen and pelvis 04/10/2014.  FINDINGS: Uterus  Measurements: 8.8 x 4.0 x 5.5 cm. No fibroids or other mass visualized. Tiny 3 mm anechoic lesion in the region of the cervix, compatible with a tiny nabothian cysts.  Endometrium  Thickness: 3.3 mm.  No focal abnormality visualized.  Right ovary  Could not be visualized secondary to bowel gas.  Left ovary  Could not be visualized secondary to bowel gas.  Other findings  No significant free fluid in the cul-de-sac.  IMPRESSION: 1. Limited study which was unable to visualize either ovary. 2. No free fluid in the cul-de-sac. 3. Tiny nabothian cysts incidentally noted.   Electronically Signed   By: Trudie Reedaniel  Entrikin M.D.   On: 05/19/2014 18:00   [place] Pelvis Complete  05/19/2014   CLINICAL DATA:  Abdominal pain. History of ovarian cysts. Evaluate for potential ovarian torsion.  EXAM: TRANSABDOMINAL AND TRANSVAGINAL ULTRASOUND OF PELVIS  TECHNIQUE: Both transabdominal and transvaginal ultrasound examinations of the pelvis were performed. Transabdominal technique was performed  for global imaging of the pelvis including uterus, ovaries, adnexal regions, and pelvic cul-de-sac. It was necessary to proceed with endovaginal exam following the transabdominal exam to visualize the endometrium and bilateral adnexal regions.  COMPARISON:  No priors.  CT of the abdomen and pelvis 04/10/2014.  FINDINGS: Uterus  Measurements: 8.8 x 4.0 x 5.5 cm. No fibroids or other mass visualized. Tiny 3 mm anechoic lesion in the region of the cervix, compatible with a tiny nabothian cysts.  Endometrium  Thickness: 3.3 mm.  No focal abnormality visualized.  Right ovary  Could not be visualized secondary to bowel gas.  Left ovary  Could not be visualized secondary to bowel gas.  Other findings  No significant free fluid in the cul-de-sac.  IMPRESSION: 1. Limited study which was unable to visualize either ovary. 2. No free fluid in the cul-de-sac. 3. Tiny nabothian cysts incidentally noted.   Electronically Signed   By: Trudie Reedaniel  Entrikin M.D.   On: 05/19/2014 18:00   Ct Abdomen  Pelvis W Contrast  05/19/2014   CLINICAL DATA:  Abdominal pain. Nausea and vomiting. Ovaries could not be visualized on sonography.  EXAM: CT ABDOMEN AND PELVIS WITH CONTRAST  TECHNIQUE: Multidetector CT imaging of the abdomen and pelvis was performed using the standard protocol following bolus administration of intravenous contrast.  CONTRAST:  100mL OMNIPAQUE IOHEXOL 300 MG/ML  SOLN  COMPARISON:  04/09/2014 ; 05/19/2014  FINDINGS: Dependent subsegmental atelectasis in both lower lobes. The liver, spleen, pancreas, and adrenal glands appear unremarkable. No specific gallbladder or biliary abnormality identified. Kidneys and proximal ureters unremarkable. No pathologic upper abdominal adenopathy is observed. No pathologic pelvic adenopathy is observed. Appendix normal. No significant ovarian enlargement or significant ovarian abnormality is apparent on CT; both ovaries appear to have small follicles. Uterus unremarkable.  The dominant  finding on today's exam is considerable abnormal wall thickening extending from the splenic flexure to the proximal sigmoid colon would associated fifth surrounding mesenteric stranding. I do not see inflamed diverticula and pattern is more one of colitis rather than diverticulitis. There is some air-fluid levels in nondilated small bowel in the adjacent left abdomen.  No significant atherosclerotic vascular calcification in the abdomen. No inflammatory findings at the terminal ileum.  IMPRESSION: 1. Considerable abnormal wall thickening of the entire descending colon, with adjacent low-grade mesenteric edema. Infectious colitis is favored. 2. Each ovary has 1-2 small follicles but the ovaries do not demonstrate the enlargement, surrounding edema, or vascular changes characteristic of ovarian infarct or torsion.   Electronically Signed   By: Herbie BaltimoreWalt  Liebkemann M.D.   On: 05/19/2014 21:04     EKG Interpretation None      MDM   Final diagnoses:  Colitis    48 year old female with history of ovarian cysts presents with lower abdominal pain. Sharp, began last night. Radiates across the entire lower abdomen. Consistent with prior cyst pain. Has followup with her OB/GYN, but felt she wanted to come in and get checked out because she was in such pain. Associated nausea, vomiting, diarrhea. No fevers. Has been amenorrheic since last August, but Saturday started having vaginal bleeding. No vaginal discharge. Very mild burning dysuria Here on exam has lower abdominal pain. Her vital signs are stable. Most of her pain is very low, just on either side of the pubic bone. Concern for ovarian cyst pain. Will perform pelvic exam and do vaginal ultrasound. On pelvic, R sided adnexal pain without mass or fullness.  Transvaginal US unable to visualize either ovary due to bowel gas. I spoke to OB/GYN, who recommended CT scan. CT shows colitis, likely source of pain. Patient has f/u in the morning with [facility] Jennette [facility]ettleNeal [facility]. Give pain meds, Cipro/Flagyl. Stable for discharge.  Dagmar HaitWilliam Blair Walden, MD 05/19/14 [phone]

## 2014-05-19 NOTE — ED Notes (Signed)
Pt presents with c/o abdominal pain that started about 11 pm last night. Pt says that her pain is in her lower abdomen, pt has a hx of ovarian cysts. Pt has had N/V/D since the pain started last night. Pt is ambulatory to triage.

## 2014-05-19 NOTE — Discharge Instructions (Signed)

## 2014-11-03 ENCOUNTER — Other Ambulatory Visit: Payer: Self-pay | Admitting: Neurology

## 2014-11-03 NOTE — Telephone Encounter (Signed)
No showed last appt  

## 2014-11-04 NOTE — Telephone Encounter (Signed)
I called the patient.  Got no answer, left message.

## 2014-11-05 NOTE — Telephone Encounter (Signed)
Patient has appt scheduled on 02/24/2015

## 2015-01-14 ENCOUNTER — Telehealth: Payer: Self-pay

## 2015-01-14 NOTE — Telephone Encounter (Signed)
Called and left patient a message to reschedule her apt. Time with Eber JonesCarolyn 02-24-15 at 2:00. CM/Willis. Please when patient calls reschedule her apt. Thanks Annabelle Harmanana.

## 2015-02-24 ENCOUNTER — Ambulatory Visit: Payer: BC Managed Care – PPO | Admitting: Nurse Practitioner

## 2015-04-27 ENCOUNTER — Other Ambulatory Visit: Payer: Self-pay | Admitting: Neurology

## 2015-05-29 ENCOUNTER — Other Ambulatory Visit: Payer: Self-pay | Admitting: Neurology

## 2015-05-29 NOTE — Telephone Encounter (Signed)
Patient has appt scheduled in July  

## 2015-06-11 ENCOUNTER — Ambulatory Visit: Payer: Self-pay | Admitting: Nurse Practitioner

## 2015-07-01 ENCOUNTER — Ambulatory Visit (INDEPENDENT_AMBULATORY_CARE_PROVIDER_SITE_OTHER): Payer: BLUE CROSS/BLUE SHIELD | Admitting: Nurse Practitioner

## 2015-07-01 ENCOUNTER — Encounter: Payer: Self-pay | Admitting: Nurse Practitioner

## 2015-07-01 VITALS — BP 132/84 | HR 56 | Ht 71.0 in | Wt 170.2 lb

## 2015-07-01 DIAGNOSIS — G40309 Generalized idiopathic epilepsy and epileptic syndromes, not intractable, without status epilepticus: Secondary | ICD-10-CM | POA: Diagnosis not present

## 2015-07-01 DIAGNOSIS — Z5181 Encounter for therapeutic drug level monitoring: Secondary | ICD-10-CM

## 2015-07-01 MED ORDER — CARBAMAZEPINE ER 200 MG PO CP12: ORAL_CAPSULE | ORAL | Status: DC

## 2015-07-01 NOTE — Progress Notes (Signed)
I have read the note, and I agree with the clinical assessment and plan.  WILLIS,CHARLES KEITH   

## 2015-07-01 NOTE — Progress Notes (Signed)
[facility]  PATIENT: Amber Sanchez DOB: 24-Oct-1966   REASON FOR VISIT: Follow-up for seizure disorder, migraines HISTORY FROM: Patient    HISTORY OF PRESENT ILLNESS:Ms. Powell is a 49 year old left-handed white female with a history of seizures. She was last seen in this office by Dr. Anne Hahn 11/01/2013. The patient indicates that she first had a seizure around age 7. The patient has done quite well over time with the seizures, with the last seizure 10/26/2013 and prior to that 10 years earlier . The patient has been on  Carbatrol taking 400 mg in the morning and 600 mg at night.  The patient has been tolerating the medication well. Sleep deprivation is a seizure trigger for her and with her last seizure she  had been up late the night before shopping. The patient got only about 4 or 5 hours of sleep. The patient had no warning with the seizure, and had a generalized convulsive event associated with tongue biting. The patient did not injure herself with the seizure. The patient did not miss any doses of medications, and the blood levels of carbamazepine in the emergency room were slightly subtherapeutic at 3.8.   The patient also has a history of migraines but has not had a recent headache and has not had to use her Imitrex in quite some time.The patient comes to this office for an evaluation.    REVIEW OF SYSTEMS: Full 14 system review of systems performed and notable only for those listed, all others are neg:  Constitutional: neg  Cardiovascular: neg Ear/Nose/Throat: neg  Skin: neg Eyes: neg Respiratory: neg Gastroitestinal: neg  Hematology/Lymphatic: neg  Endocrine: neg Musculoskeletal:neg Allergy/Immunology: neg Neurological: neg Psychiatric: neg Sleep : neg   ALLERGIES: Allergies  Allergen Reactions  . Penicillins     Childhood, unknown    HOME MEDICATIONS: Outpatient Prescriptions Prior to Visit  Medication Sig Dispense Refill  .  carbamazepine (CARBATROL) 200 MG 12 hr capsule TAKE 2 CAPSULES BY MOUTH EVERY MORNING & 3 CAPSULES IN THE EVENING 50 capsule 0  . cholecalciferol (VITAMIN D) 1000 UNITS tablet Take 1,000 Units by mouth daily.    Marland Kitchen ibuprofen (ADVIL,MOTRIN) 200 MG tablet Take 400 mg by mouth every 6 (six) hours as needed for moderate pain.    . SUMAtriptan (IMITREX) 100 MG tablet Take 100 mg by mouth every 2 (two) hours as needed for migraine (onset of migraine).    . carbamazepine (CARBATROL) 200 MG 12 hr capsule TAKE 2 CAPSULES BY MOUTH EVERY MORNING & 3 CAPSULES IN THE EVENING 75 capsule 0  . ciprofloxacin (CIPRO) 500 MG tablet Take 1 tablet (500 mg total) by mouth 2 (two) times daily. One po bid x 7 days 14 tablet 0  . HYDROcodone-acetaminophen (NORCO/VICODIN) 5-325 MG per tablet Take 1 tablet by mouth every 6 (six) hours as needed for moderate pain. 15 tablet 0  . HYDROcodone-acetaminophen (NORCO/VICODIN) 5-325 MG per tablet Take 1 tablet by mouth every 6 (six) hours as needed for moderate pain. 20 tablet 0  . metroNIDAZOLE (FLAGYL) 500 MG tablet Take 1 tablet (500 mg total) by mouth 2 (two) times daily. One po bid x 7 days 14 tablet 0   No facility-administered medications prior to visit.    PAST MEDICAL HISTORY: Past Medical History  Diagnosis Date  . Migraine   . Seizures   . 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  Eye surgery      age 67  . Foot surgery      11/2010 left foot stress fracture    FAMILY HISTORY: Family History  Problem Relation Age of Onset  . Cancer Mother     breast   . Cancer - Prostate Maternal Grandfather   . Cancer Paternal Grandfather     bladder   . Stroke      one grandmother   . High blood pressure      one grandmother   . Seizures Neg Hx     SOCIAL HISTORY: History   Social History  . Marital Status: Married    Spouse Name: Loraine Leriche  .  Number of Children: 2  . Years of Education: college   Occupational History  .     Social History Main Topics  . Smoking status: Never Smoker   . Smokeless tobacco: Never Used  . Alcohol Use: 1.0 - 1.5 oz/week    2-3 drink(s) per week     Comment: mixed drinks weekly   . Drug Use: No  . Sexual Activity: Not on file   Other Topics Concern  . Not on file   Social History Narrative   Patient has completed her masters in [employer] in an online course.    Patient works at [employer].    Patient has been married for 18 years and has 2 children. Ages 66 and 71   Patient drinks 3 cups of coffee daily.      PHYSICAL EXAM  Filed Vitals:   07/01/15 0928  BP: 132/84  Pulse: 56  Height:  (1.803 m)  Weight: 170 lb 3.2 oz (77.202 kg)   Body mass index is 23.75 kg/(m^2). General: The patient is alert and cooperative at the time of the examination. Skin: No significant peripheral edema is noted. Neurologic Exam Mental status: The patient is oriented x 3. Cranial nerves: Facial symmetry is present. Speech is normal, no aphasia or dysarthria is noted. Extraocular movements are full. Visual fields are full. Motor: The patient has good strength in all 4 extremities. no focal weakness Sensory examination: Soft touch sensation on the face, arms, and legs is symmetric. Coordination: The patient has good finger-nose-finger and heel-to-shin bilaterally. Gait and station: The patient has a normal gait. Tandem gait is normal. Romberg is negative. No drift is seen. Reflexes: Deep tendon reflexes are symmetric.   DIAGNOSTIC DATA (LABS, IMAGING, TESTING) -   ASSESSMENT AND PLAN  49 y.o. year old female  has a past medical history of Migraine; Seizures; and Generalized convulsive epilepsy without mention of intractable epilepsy (11/01/2013). here  to follow-up. She is currently on Imitrex for her migraine and Carbatrol for her seizure disorder.  Continue Carbatrol at  current dose will refill for one year Check labs today, CBC, CMP and CBZ level Call for any seizure activity Follow-up yearly and when necessary Nilda Riggs, [facility], [facility], APRN  [facility] [address], [address] [place], [place] [address] [phone]

## 2015-07-01 NOTE — Patient Instructions (Signed)
Continue Carbatrol at current dose will refill for one year Check labs today Call for any seizure activity Follow-up yearly and when necessary

## 2015-07-02 ENCOUNTER — Telehealth: Payer: Self-pay | Admitting: *Deleted

## 2015-07-02 LAB — COMPREHENSIVE METABOLIC PANEL
ALT: 10 IU/L (ref 0–32)
AST: 14 IU/L (ref 0–40)
Albumin/Globulin Ratio: 2.1 (ref 1.1–2.5)
Albumin: 4.5 g/dL (ref 3.5–5.5)
Alkaline Phosphatase: 79 IU/L (ref 39–117)
BUN/Creatinine Ratio: 26 — ABNORMAL HIGH (ref 9–23)
BUN: 17 mg/dL (ref 6–24)
Bilirubin Total: 0.3 mg/dL (ref 0.0–1.2)
CALCIUM: 8.8 mg/dL (ref 8.7–10.2)
CO2: 26 mmol/L (ref 18–29)
Chloride: 103 mmol/L (ref 97–108)
Creatinine, Ser: 0.65 mg/dL (ref 0.57–1.00)
GFR calc Af Amer: 121 mL/min/{1.73_m2} (ref >59)
GFR, EST NON AFRICAN AMERICAN: 105 mL/min/{1.73_m2} (ref >59)
GLOBULIN, TOTAL: 2.1 g/dL (ref 1.5–4.5)
Glucose: 92 mg/dL (ref 65–99)
Potassium: 4.6 mmol/L (ref 3.5–5.2)
Sodium: 144 mmol/L (ref 134–144)
TOTAL PROTEIN: 6.6 g/dL (ref 6.0–8.5)

## 2015-07-02 LAB — CBC WITH DIFFERENTIAL/PLATELET
BASOS ABS: 0 10*3/uL (ref 0.0–0.2)
Basos: 1 %
EOS (ABSOLUTE): 0.1 10*3/uL (ref 0.0–0.4)
Eos: 2 %
HEMOGLOBIN: 13.3 g/dL (ref 11.1–15.9)
Hematocrit: 40.6 % (ref 34.0–46.6)
IMMATURE GRANULOCYTES: 0 %
Immature Grans (Abs): 0 10*3/uL (ref 0.0–0.1)
LYMPHS: 47 %
Lymphocytes Absolute: 2.1 10*3/uL (ref 0.7–3.1)
MCH: 29.8 pg (ref 26.6–33.0)
MCHC: 32.8 g/dL (ref 31.5–35.7)
MCV: 91 fL (ref 79–97)
MONOCYTES: 9 %
Monocytes Absolute: 0.4 10*3/uL (ref 0.1–0.9)
NEUTROS ABS: 1.9 10*3/uL (ref 1.4–7.0)
Neutrophils: 41 %
PLATELETS: 170 10*3/uL (ref 150–379)
RBC: 4.47 x10E6/uL (ref 3.77–5.28)
RDW: 13.5 % (ref 12.3–15.4)
WBC: 4.5 10*3/uL (ref 3.4–10.8)

## 2015-07-02 LAB — CARBAMAZEPINE LEVEL, TOTAL: CARBAMAZEPINE LVL: 6.7 ug/mL (ref 4.0–12.0)

## 2015-07-02 NOTE — Telephone Encounter (Signed)
-----   Message from Nilda Riggs, NP sent at 07/02/2015  8:29 AM EDT ----- Labs look good please call the patient

## 2015-07-02 NOTE — Telephone Encounter (Signed)
I called and LMVM for pt on her mobile # to return call for lab results.  ( labs look good).

## 2015-07-03 ENCOUNTER — Encounter: Payer: Self-pay | Admitting: *Deleted

## 2015-07-03 NOTE — Telephone Encounter (Signed)
Patient returned Sandy's phone call. I relayed to patient labs looked good.

## 2015-08-06 ENCOUNTER — Telehealth: Payer: Self-pay | Admitting: *Deleted

## 2015-08-06 NOTE — Telephone Encounter (Signed)
I called pts cell and LMVM for her to return call.  (checking on her, received call from [facility] [address]).

## 2015-08-06 NOTE — Telephone Encounter (Signed)
Events noted. I was not aware that Carbatrol had an immediate release preparation. Carbatrol is a brand name generic, only long-acting preparation.

## 2015-08-06 NOTE — Telephone Encounter (Signed)
Dolphus Jenny, Pharmacist [facility] [phone] calling about pt and medication error that has happened.  Pt picked up new prescription of carbatrol IR  tablets (has been taking 2 tabs in am and 3 tabs in pm).  She should have been given carbatrol  XR capsules. (same dose).  Mr. Ouida Sills caught this on 07-30-15.  Spoke to pt and she has had no seizures , just more drowsiness then her normal.  The medication was filled with new drug, but the pt has not picked up.  (there is note that says returned to stock).  He wanted to let us know.

## 2015-10-04 IMAGING — CT CT ABD-PELV W/ CM
1 of 3 series · 14 of 32 positions shown, 19 images · IV contrast (omnipaque)
Comparison: None.

CLINICAL DATA: Low abdominal pain and pressure for 2 days. Urinary
frequency.

EXAM:
CT ABDOMEN AND PELVIS WITH CONTRAST
TECHNIQUE: Multidetector CT imaging of the abdomen and pelvis was performed
using the standard protocol following bolus administration of
intravenous contrast.
CONTRAST:  50mL OMNIPAQUE IOHEXOL 300 MG/ML SOLN, 100mL OMNIPAQUE
IOHEXOL 300 MG/ML SOLN

[Series 2: abd/pel with · axial · 0.74mm/px · z∈[+971,+1386]mm · 14 of 93 slices shown, 19 images]
[im 5/93  soft-tissue]
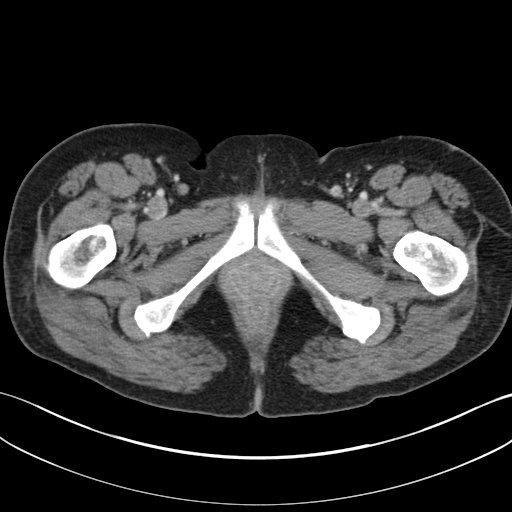
[im 5/93  bone]
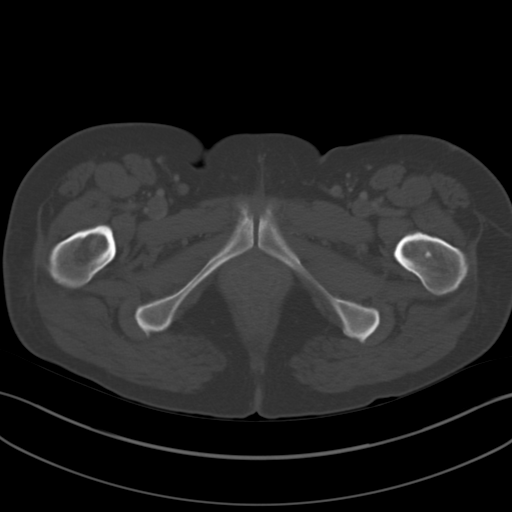
[im 15/93  soft-tissue]
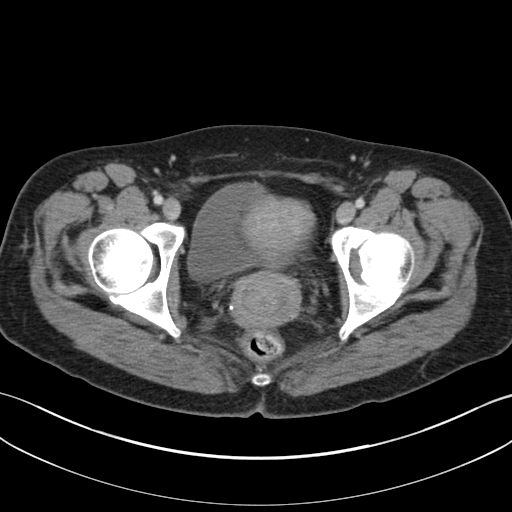
[im 20/93  soft-tissue]
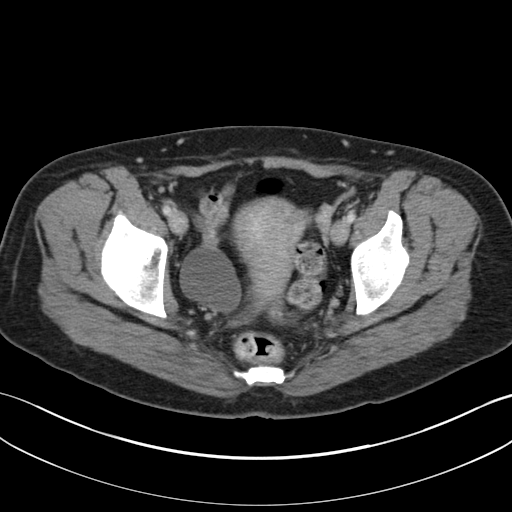
[im 25/93  soft-tissue]
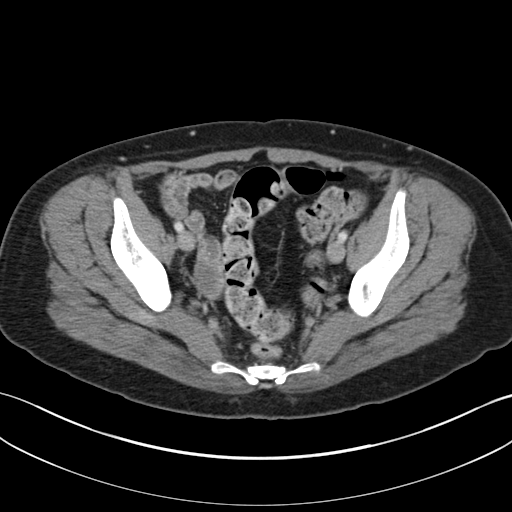
[im 34/93  soft-tissue]
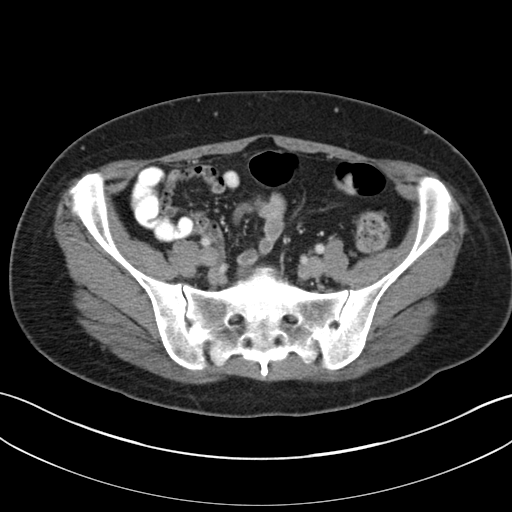
[im 39/93  soft-tissue]
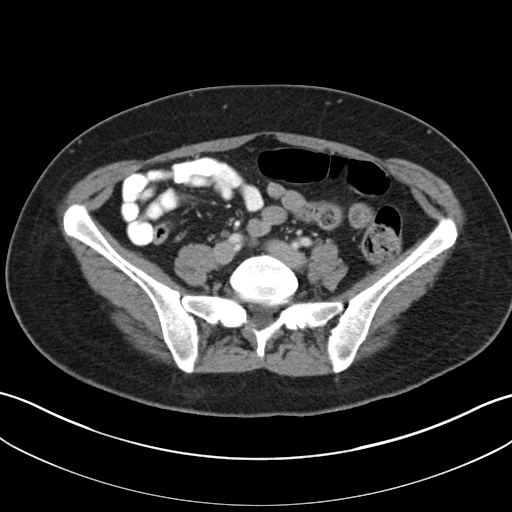
[im 49/93  soft-tissue]
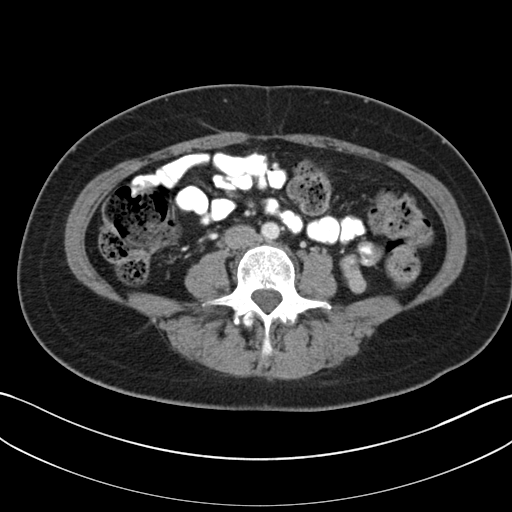
[im 54/93  soft-tissue]
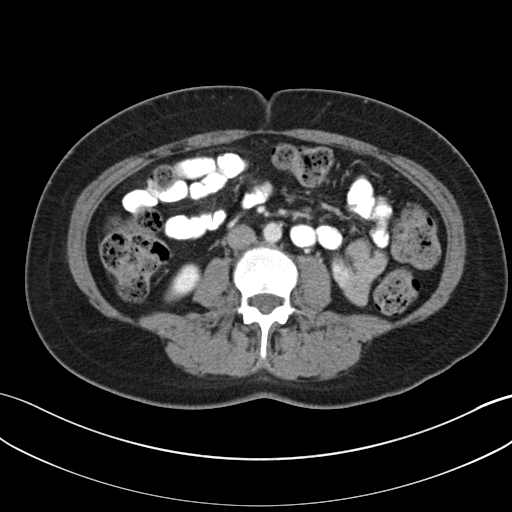
[im 59/93  soft-tissue]
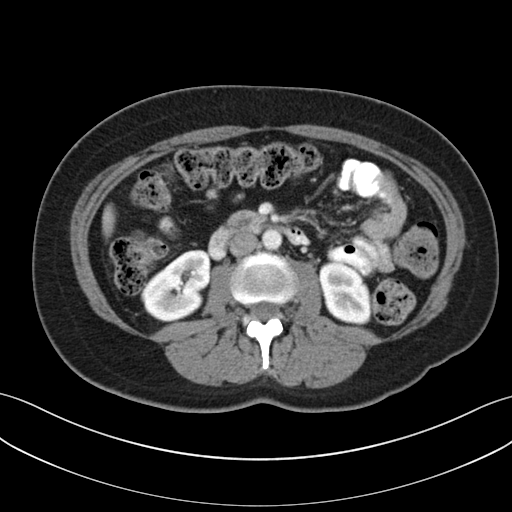
[im 59/93  bone]
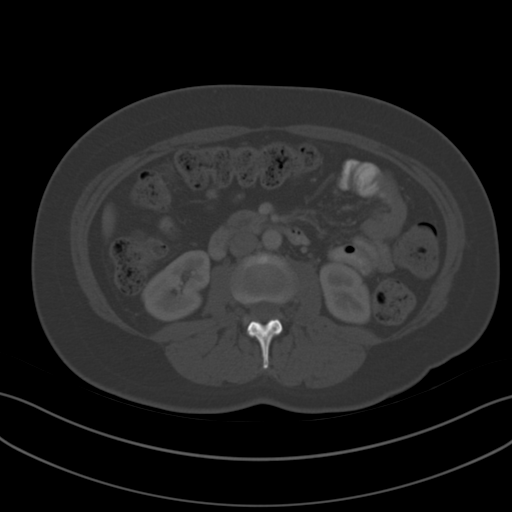
[im 68/93  soft-tissue]
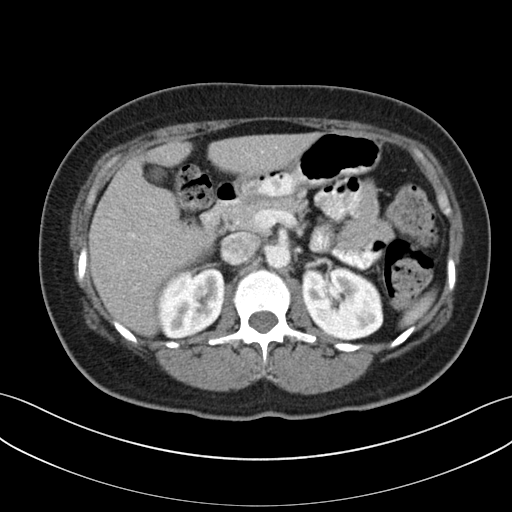
[im 73/93  soft-tissue]
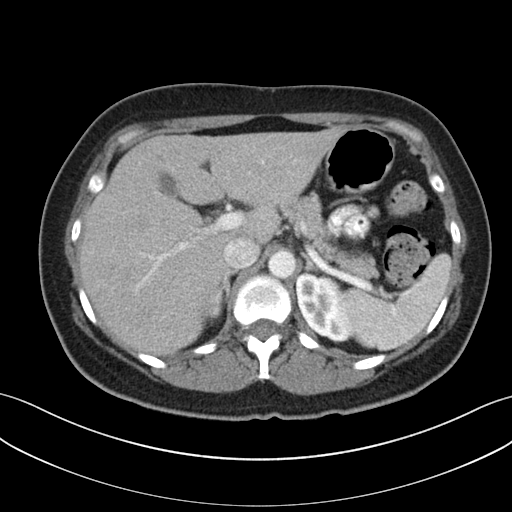
[im 73/93  lung]
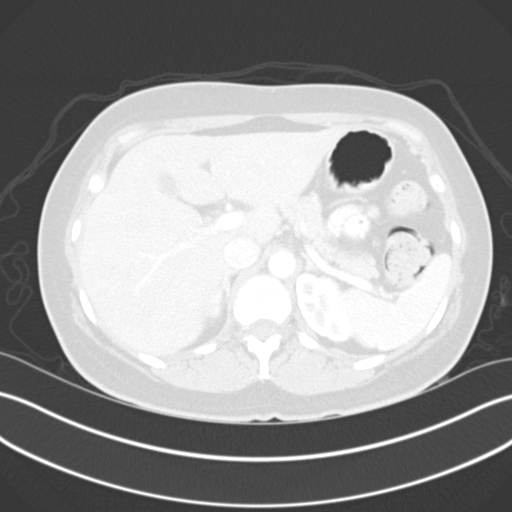
[im 78/93  soft-tissue]
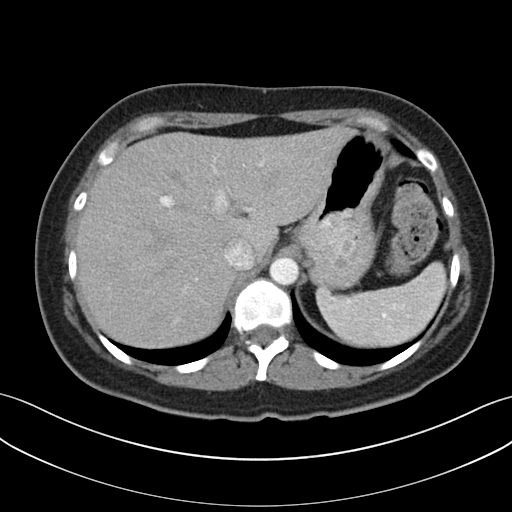
[im 78/93  lung]
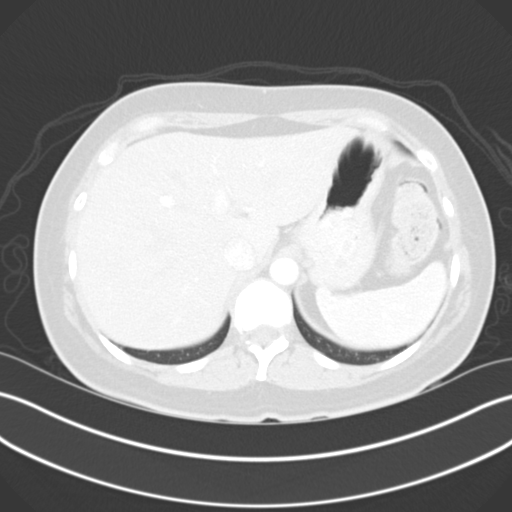
[im 83/93  lung]
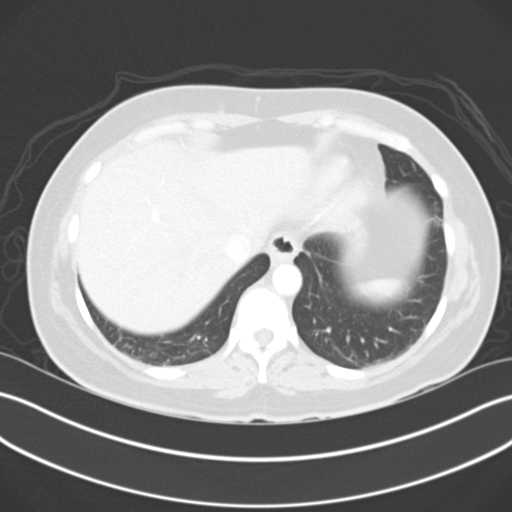
[im 88/93  soft-tissue]
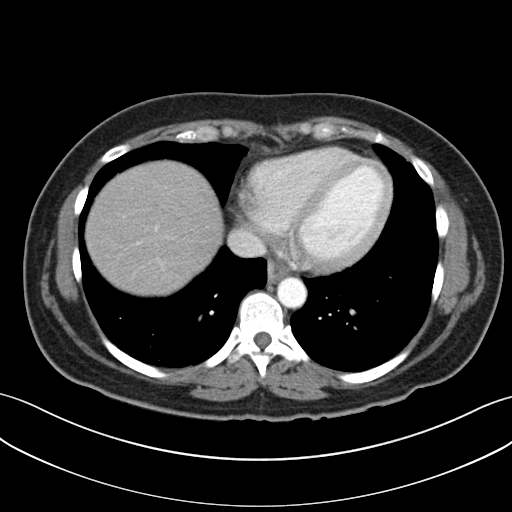
[im 88/93  lung]
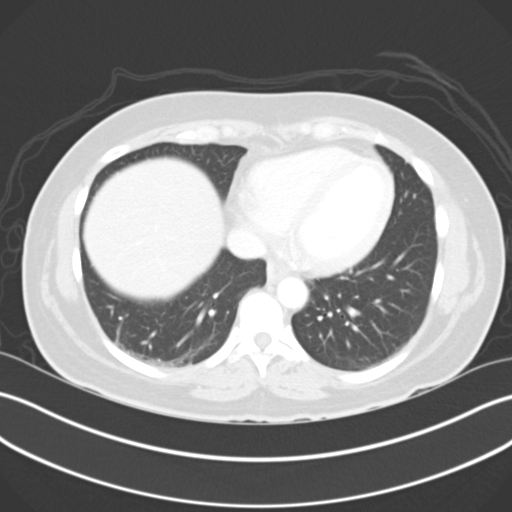

[14 of 32 positions shown; findings below may reference images not displayed]

FINDINGS: Mild dependent changes in the lung bases.

The liver, spleen, gallbladder, pancreas, adrenal glands, kidneys,
abdominal aorta, inferior vena cava, and retroperitoneal lymph nodes
are unremarkable. The stomach and small bowel are not abnormally
distended. Stool-filled colon without distention. No free air or
free fluid in the abdomen.

Pelvis: The appendix is normal. Uterus is anteverted without
enlargement. Left ovary is normal. Cystic structure on the right
ovary measuring 5.1 cm maximal diameter. Followup ultrasound in 6-12
weeks is recommended. No free or loculated pelvic fluid collections.
Bladder wall is not thickened. No significant lymphadenopathy in the
pelvis. No evidence of diverticulitis. Mild degenerative changes in
spine. No destructive bone lesions appreciated.
IMPRESSION: Indeterminate cystic structure in the right ovary measuring 5.1 cm
maximal diameter. Followup ultrasound in 6-12 weeks is recommended.
No acute process otherwise identified in the abdomen or pelvis.

## 2016-06-30 ENCOUNTER — Ambulatory Visit: Payer: BLUE CROSS/BLUE SHIELD | Admitting: Nurse Practitioner

## 2016-08-09 ENCOUNTER — Other Ambulatory Visit: Payer: Self-pay | Admitting: Nurse Practitioner

## 2016-08-30 ENCOUNTER — Other Ambulatory Visit: Payer: Self-pay | Admitting: Nurse Practitioner

## 2016-09-25 ENCOUNTER — Other Ambulatory Visit: Payer: Self-pay | Admitting: Nurse Practitioner

## 2016-09-29 ENCOUNTER — Ambulatory Visit: Payer: BLUE CROSS/BLUE SHIELD | Admitting: Nurse Practitioner

## 2016-10-18 ENCOUNTER — Ambulatory Visit (INDEPENDENT_AMBULATORY_CARE_PROVIDER_SITE_OTHER): Payer: BLUE CROSS/BLUE SHIELD | Admitting: Nurse Practitioner

## 2016-10-18 ENCOUNTER — Encounter: Payer: Self-pay | Admitting: Nurse Practitioner

## 2016-10-18 VITALS — BP 128/70 | HR 69 | Ht 71.0 in | Wt 164.8 lb

## 2016-10-18 DIAGNOSIS — G40309 Generalized idiopathic epilepsy and epileptic syndromes, not intractable, without status epilepticus: Secondary | ICD-10-CM

## 2016-10-18 DIAGNOSIS — G43809 Other migraine, not intractable, without status migrainosus: Secondary | ICD-10-CM

## 2016-10-18 DIAGNOSIS — G43909 Migraine, unspecified, not intractable, without status migrainosus: Secondary | ICD-10-CM | POA: Insufficient documentation

## 2016-10-18 MED ORDER — CARBAMAZEPINE ER 200 MG PO CP12: ORAL_CAPSULE | ORAL | 11 refills | Status: DC

## 2016-10-18 NOTE — Patient Instructions (Signed)
Continue Carbatrol at current dose will refill for one year Check labs today, CBC, CMP and CBZ level Call for any seizure activity Follow-up yearly and when necessary

## 2016-10-18 NOTE — Progress Notes (Signed)
[facility]  PATIENT: Amber Sanchez DOB: 1966-02-26   REASON FOR VISIT: Follow-up for seizure disorder, migraines HISTORY FROM: Patient    HISTORY OF PRESENT ILLNESS:Ms. Amber Sanchez is a 50 year old left-handed white female with a history of seizures.  The patient indicates that she first had a seizure around age 50. The patient has done quite well over time with the seizures, with the last seizure 10/26/2013 and prior to that 10 years earlier . The patient has been on  Carbatrol taking 400 mg in the morning and 600 mg at night.  The patient has been tolerating the medication well. Sleep deprivation is a seizure trigger for her and with her last seizure she  had been up late the night before shopping. The patient got only about 4 or 5 hours of sleep. The patient had no warning with the seizure, and had a generalized convulsive event associated with tongue biting. The patient did not injure herself with the seizure. The patient did not miss any doses of medications, and the blood levels of carbamazepine in the emergency room were slightly subtherapeutic at 3.8.   The patient also has a history of migraines but has not had a recent headache and has not had to use her Imitrex in quite some time.The patient comes to this office for an evaluation.    REVIEW OF SYSTEMS: Full 14 system review of systems performed and notable only for those listed, all others are neg:  Constitutional: neg  Cardiovascular: neg Ear/Nose/Throat: neg  Skin: neg Eyes: neg Respiratory: neg Gastroitestinal: neg  Hematology/Lymphatic: neg  Endocrine: neg Musculoskeletal:neg Allergy/Immunology: neg Neurological: neg Psychiatric: neg Sleep : neg   ALLERGIES: Allergies  Allergen Reactions  . Penicillins     Childhood, unknown    HOME MEDICATIONS: Outpatient Medications Prior to Visit  Medication Sig Dispense Refill  . carbamazepine (CARBATROL) 200 MG 12 hr capsule TAKE 2 CAPSULES BY  MOUTH EVERY MORNING & 3 CAPSULES IN THE EVENING 150 capsule 0  . cholecalciferol (VITAMIN D) 1000 UNITS tablet Take 1,000 Units by mouth daily.    Marland Kitchen. ibuprofen (ADVIL,MOTRIN) 200 MG tablet Take 400 mg by mouth every 6 (six) hours as needed for moderate pain.    . SUMAtriptan (IMITREX) 100 MG tablet Take 100 mg by mouth every 2 (two) hours as needed for migraine (onset of migraine).     No facility-administered medications prior to visit.     PAST MEDICAL HISTORY: Past Medical History:  Diagnosis Date  . 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 Migraine   . Seizures (HCC)     PAST SURGICAL HISTORY: Past Surgical History:  Procedure Laterality Date  . EYE SURGERY     age 89  . FOOT SURGERY     11/2010 left foot stress fracture    FAMILY HISTORY: Family History  Problem Relation Age of Onset  . Cancer Mother     breast   . Cancer - Prostate Maternal Grandfather   . Cancer Paternal Grandfather     bladder   . Stroke      one grandmother   . High blood pressure      one grandmother   . Seizures Neg Hx     SOCIAL HISTORY: Social History   Social History  . Marital status: Married    Spouse name: Amber Sanchez  . Number of children: 2  . Years of education: college   Occupational History  .  Unemployed   Social  History Main Topics  . Smoking status: Never Smoker  . Smokeless tobacco: Never Used  . Alcohol use 1.0 - 1.5 oz/week    2 - 3 drink(s) per week     Comment: mixed drinks weekly   . Drug use: No  . Sexual activity: Not on file   Other Topics Concern  . Not on file   Social History Narrative   Patient has completed her masters in [employer] in an online course.    Patient works at [employer].    Patient has been married for 18 years and has 2 children. Ages 2916 and 3111   Patient drinks 3 cups of coffee daily.      PHYSICAL EXAM  Vitals:   10/18/16 1406  BP: 128/70  Pulse: 69  Weight: 164 lb  12.8 oz (74.8 kg)  Height: 5\' 11"  (1.803 m)   Body mass index is 22.98 kg/m. General: The patient is alert and cooperative at the time of the examination. Skin: No significant peripheral edema is noted. Neurologic Exam Mental status: The patient is oriented x 3. Cranial nerves: Facial symmetry is present. Speech is normal, no aphasia or dysarthria is noted. Extraocular movements are full. Visual fields are full. Motor: The patient has good strength in all 4 extremities. no focal weakness Sensory examination: Soft touch sensation on the face, arms, and legs is symmetric. Coordination: The patient has good finger-nose-finger and heel-to-shin bilaterally. Gait and station: The patient has a normal gait. Tandem gait is normal. Romberg is negative. No drift is seen. Reflexes: Deep tendon reflexes are symmetric.   DIAGNOSTIC DATA (LABS, IMAGING, TESTING) -   ASSESSMENT AND PLAN  50 y.o. year old female  has a past medical history of Migraine; Seizures; and Generalized convulsive epilepsy without mention of intractable epilepsy (11/01/2013). here  to follow-up. She is currently on Imitrex for her migraine and Carbatrol for her seizure disorder.  Continue Carbatrol at current dose will refill for one year Check labs today, CBC, CMP and CBZ level Call for any seizure activity Follow-up yearly and when necessary Amber Sanchez, [facility] HospitalGNP, [facility]BC, APRN  [facility] [address], [address] [place], [place] [address] [phone] Floor on call that in to your pharmacy she has less medication this is considered a muscle relaxer as it is but I do Zoloft for migraine headaches

## 2016-10-19 ENCOUNTER — Telehealth: Payer: Self-pay | Admitting: *Deleted

## 2016-10-19 LAB — CBC WITH DIFFERENTIAL/PLATELET
BASOS: 1 %
Basophils Absolute: 0 10*3/uL (ref 0.0–0.2)
EOS (ABSOLUTE): 0.1 10*3/uL (ref 0.0–0.4)
Eos: 2 %
Hematocrit: 40.7 % (ref 34.0–46.6)
Hemoglobin: 13.5 g/dL (ref 11.1–15.9)
IMMATURE GRANULOCYTES: 0 %
Immature Grans (Abs): 0 10*3/uL (ref 0.0–0.1)
LYMPHS ABS: 2.6 10*3/uL (ref 0.7–3.1)
Lymphs: 43 %
MCH: 30.2 pg (ref 26.6–33.0)
MCHC: 33.2 g/dL (ref 31.5–35.7)
MCV: 91 fL (ref 79–97)
MONOS ABS: 0.5 10*3/uL (ref 0.1–0.9)
Monocytes: 8 %
NEUTROS PCT: 46 %
Neutrophils Absolute: 2.8 10*3/uL (ref 1.4–7.0)
PLATELETS: 208 10*3/uL (ref 150–379)
RBC: 4.47 x10E6/uL (ref 3.77–5.28)
RDW: 13.4 % (ref 12.3–15.4)
WBC: 6 10*3/uL (ref 3.4–10.8)

## 2016-10-19 LAB — CARBAMAZEPINE LEVEL, TOTAL: Carbamazepine (Tegretol), S: 7.7 ug/mL (ref 4.0–12.0)

## 2016-10-19 LAB — COMPREHENSIVE METABOLIC PANEL
ALBUMIN: 4.3 g/dL (ref 3.5–5.5)
ALK PHOS: 80 IU/L (ref 39–117)
ALT: 10 IU/L (ref 0–32)
AST: 15 IU/L (ref 0–40)
Albumin/Globulin Ratio: 1.8 (ref 1.2–2.2)
BUN / CREAT RATIO: 26 — AB (ref 9–23)
BUN: 17 mg/dL (ref 6–24)
Bilirubin Total: 0.2 mg/dL (ref 0.0–1.2)
CO2: 26 mmol/L (ref 18–29)
CREATININE: 0.65 mg/dL (ref 0.57–1.00)
Calcium: 9 mg/dL (ref 8.7–10.2)
Chloride: 101 mmol/L (ref 96–106)
GFR calc Af Amer: 120 mL/min/{1.73_m2} (ref >59)
GFR, EST NON AFRICAN AMERICAN: 104 mL/min/{1.73_m2} (ref >59)
GLOBULIN, TOTAL: 2.4 g/dL (ref 1.5–4.5)
Glucose: 96 mg/dL (ref 65–99)
POTASSIUM: 4.7 mmol/L (ref 3.5–5.2)
SODIUM: 144 mmol/L (ref 134–144)
Total Protein: 6.7 g/dL (ref 6.0–8.5)

## 2016-10-19 NOTE — Telephone Encounter (Signed)
Per Enid Skeens Martin, NP, LVM informing patient her labs look good. Left name, number for any questions.

## 2016-11-14 ENCOUNTER — Emergency Department (HOSPITAL_COMMUNITY)
Admission: EM | Admit: 2016-11-14 | Discharge: 2016-11-14 | Disposition: A | Discharge status: Home / Self Care | Payer: BLUE CROSS/BLUE SHIELD | Attending: Emergency Medicine | Admitting: Emergency Medicine

## 2016-11-14 ENCOUNTER — Encounter (HOSPITAL_COMMUNITY): Payer: Self-pay | Admitting: Emergency Medicine

## 2016-11-14 ENCOUNTER — Emergency Department (HOSPITAL_COMMUNITY): Payer: BLUE CROSS/BLUE SHIELD

## 2016-11-14 DIAGNOSIS — R079 Chest pain, unspecified: Secondary | ICD-10-CM | POA: Diagnosis present

## 2016-11-14 DIAGNOSIS — R0789 Other chest pain: Secondary | ICD-10-CM | POA: Diagnosis not present

## 2016-11-14 LAB — I-STAT TROPONIN, ED
TROPONIN I, POC: 0 ng/mL (ref 0.00–0.08)
TROPONIN I, POC: 0 ng/mL (ref 0.00–0.08)

## 2016-11-14 LAB — CBC
HCT: 37.1 % (ref 36.0–46.0)
HEMOGLOBIN: 12.5 g/dL (ref 12.0–15.0)
MCH: 30.9 pg (ref 26.0–34.0)
MCHC: 33.7 g/dL (ref 30.0–36.0)
MCV: 91.6 fL (ref 78.0–100.0)
PLATELETS: 150 10*3/uL (ref 150–400)
RBC: 4.05 MIL/uL (ref 3.87–5.11)
RDW: 12.6 % (ref 11.5–15.5)
WBC: 6.6 10*3/uL (ref 4.0–10.5)

## 2016-11-14 LAB — BASIC METABOLIC PANEL
ANION GAP: 8 (ref 5–15)
BUN: 19 mg/dL (ref 6–20)
CALCIUM: 8.5 mg/dL — AB (ref 8.9–10.3)
CO2: 27 mmol/L (ref 22–32)
CREATININE: 0.71 mg/dL (ref 0.44–1.00)
Chloride: 106 mmol/L (ref 101–111)
GFR calc Af Amer: >60 mL/min (ref >60)
GFR calc non Af Amer: >60 mL/min (ref >60)
Glucose, Bld: 119 mg/dL — ABNORMAL HIGH (ref 65–99)
Potassium: 3.5 mmol/L (ref 3.5–5.1)
SODIUM: 141 mmol/L (ref 135–145)

## 2016-11-14 LAB — TROPONIN I: Troponin I: <0.03 ng/mL (ref <0.03)

## 2016-11-14 MED ORDER — ACETAMINOPHEN 325 MG PO TABS
650.0000 mg | ORAL_TABLET | Freq: Once | ORAL | Status: AC
  Administered 2016-11-14: 650 mg via ORAL
  Filled 2016-11-14: qty 2

## 2016-11-14 MED ORDER — IBUPROFEN 200 MG PO TABS
600.0000 mg | ORAL_TABLET | Freq: Once | ORAL | Status: AC
  Administered 2016-11-14: 600 mg via ORAL
  Filled 2016-11-14: qty 3

## 2016-11-14 NOTE — ED Triage Notes (Addendum)
Pt presents with complaint of left sided chest pain that has been present for last couple hours. Pt states pain is radiating towards her left arm. Pt denies any n/v/d.

## 2016-11-14 NOTE — ED Provider Notes (Signed)
[facility]-EMERGENCY DEPT Provider Note   CSN: [id number] Arrival date & time: 11/14/16  2032     History   Chief Complaint Chief Complaint  Patient presents with  . Chest Pain    HPI Amber Sanchez is a 50 y.o. female.  The history is provided by the patient. No language interpreter was used.  Chest Pain     Amber Sanchez is a 50 y.o. female who presents to the Emergency Department complaining of chest pain.  She reports left-sided chest pain that began about 5 PM while she was out shopping. Pain is described as a sharp and burning sensation that radiates through to her left back. She has an occasional increased pain with no clear alleviating or worsening factors. She did play tennis earlier today with no difficulties. She is left-handed. She denies any fevers, diaphoresis, nausea, abdominal pain. She does report feeling more tired currently and slight shortness of breath when coming to the emergency department. No lower extremity swelling or pain. No hormone use. No history of DVT or PE. No family history of coronary artery disease. Past Medical History:  Diagnosis Date  . 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 Migraine   . Seizures [facility](HCC)     Patient Active Problem List   Diagnosis Date Noted  . Migraine 10/18/2016  . Generalized convulsive epilepsy (HCC) 11/01/2013    Past Surgical History:  Procedure Laterality Date  . EYE SURGERY     age 89  . FOOT SURGERY     11/2010 left foot stress fracture    OB History    No data available       Home Medications    Prior to Admission medications   Medication Sig Start Date End Date Taking? Authorizing Provider  carbamazepine (CARBATROL) 200 MG 12 hr capsule TAKE 2 CAPSULES BY MOUTH EVERY MORNING & 3 CAPSULES IN THE EVENING Patient taking differently: Take 400-600 mg by mouth 2 (two) times daily. TAKE 3 CAPSULES BY MOUTH EVERY MORNING & 2 CAPSULES IN THE EVENING 10/18/16   Yes Nilda RiggsNancy Carolyn Martin, NP  naproxen sodium (ANAPROX) 220 MG tablet Take 440 mg by mouth 2 (two) times daily with a meal.   Yes Historical Provider, MD  SUMAtriptan (IMITREX) 100 MG tablet Take 100 mg by mouth every 2 (two) hours as needed for migraine (onset of migraine).   Yes Historical Provider, MD    Family History Family History  Problem Relation Age of Onset  . Cancer Mother     breast   . Cancer - Prostate Maternal Grandfather   . Cancer Paternal Grandfather     bladder   . Stroke      one grandmother   . High blood pressure      one grandmother   . Seizures Neg Hx     Social History Social History  Substance Use Topics  . Smoking status: Never Smoker  . Smokeless tobacco: Never Used  . Alcohol use 1.0 - 1.5 oz/week    2 - 3 drink(s) per week     Comment: mixed drinks weekly      Allergies   Penicillins   Review of Systems Review of Systems  Cardiovascular: Positive for chest pain.  All other systems reviewed and are negative.    Physical Exam Updated Vital Signs BP (!) 105/54   Pulse 60   Resp 11   Ht 5\' 11"  (1.803 m)   Wt 158 lb (71.7 kg)  SpO2 98%   BMI 22.04 kg/m   Physical Exam  Constitutional: She is oriented to person, place, and time. She appears well-developed and well-nourished.  HENT:  Head: Normocephalic and atraumatic.  Cardiovascular: Normal rate and regular rhythm.   No murmur heard. Pulmonary/Chest: Effort normal and breath sounds normal. No respiratory distress.  Left-sided chest wall tenderness to palpation without any lesions or rashes  Abdominal: Soft. There is no tenderness. There is no rebound and no guarding.  Musculoskeletal: She exhibits no edema or tenderness.  2+ radial pulses bilaterally  Neurological: She is alert and oriented to person, place, and time.  Skin: Skin is warm and dry.  Psychiatric: She has a normal mood and affect. Her behavior is normal.  Nursing note and vitals reviewed.    ED Treatments /  Results  Labs (all labs ordered are listed, but only abnormal results are displayed) Labs Reviewed  BASIC METABOLIC PANEL - Abnormal; Notable for the following:       Result Value   Glucose, Bld 119 (*)    Calcium 8.5 (*)    All other components within normal limits  CBC  TROPONIN I  Rosezena SensorI-STAT TROPOININ, ED  I-STAT TROPOININ, ED    EKG  EKG Interpretation  Date/Time:  Sunday November 14 2016 20:40:00 EST Ventricular Rate:  63 PR Interval:    QRS Duration: 125 QT Interval:  442 QTC Calculation: 453 R Axis:   81 Text Interpretation:  Ectopic atrial rhythm Right bundle branch block Anteroseptal infarct, age indeterminate Confirmed by Ranae PalmsYELVERTON  MD, DAVID ([id number]) on 11/14/2016 8:43:58 PM       Radiology Dg Chest 2 View  Result Date: 11/14/2016 CLINICAL DATA:  Left-sided chest pain EXAM: CHEST  2 VIEW COMPARISON:  None. FINDINGS: Cardiomediastinal contours are normal. No pneumothorax or pleural effusion. No focal airspace consolidation or pulmonary edema. IMPRESSION: Clear lungs. Electronically Signed   By: Deatra RobinsonKevin  Herman M.D.   On: 11/14/2016 21:21    Procedures Procedures (including critical care time)  Medications Ordered in ED Medications  acetaminophen (TYLENOL) tablet 650 mg (650 mg Oral Given 11/14/16 2052)  ibuprofen (ADVIL,MOTRIN) tablet 600 mg (600 mg Oral Given 11/14/16 2247)     Initial Impression / Assessment and Plan / ED Course  I have reviewed the triage vital signs and the nursing notes.  Pertinent labs & imaging results that were available during my care of the patient were reviewed by me and considered in my medical decision making (see chart for details).  Clinical Course     Patient here for evaluation of left-sided chest pain is reproducible on examination. EKG with right bundle branch block and no priors available for comparison. Presentation is not consistent with ACS, PE, dissection. Plan to DC home with home care for musculoskeletal chest pain.  Discussed outpatient follow-up and return precautions.  Final Clinical Impressions(s) / ED Diagnoses   Final diagnoses:  Chest wall pain    New Prescriptions Discharge Medication List as of 11/14/2016 11:30 PM       Tilden FossaElizabeth Titania Gault, MD 11/15/16 [phone]

## 2016-11-14 NOTE — ED Notes (Signed)
ED Provider at bedside. 

## 2016-11-19 NOTE — Progress Notes (Signed)
Cardiology Office Note    Date:  11/25/2016   ID:  Amber Bandyngelia L Palomo, DOB 04-Sep-1966, MRN [id number]  PCP:  Irving CopasHACKER,ROBERT KELLER, MD  Cardiologist:  New  Chief Complaint: Chest pain  History of Present Illness:   Amber Sanchez is a 50 y.o. female with no prior cardiac hx who recently seen in ER 11/14/16 for left sided chest pain. The pain started when she was doing Christmas shopping. Pain is described as a sharp and burning sensation that radiates through to her left shoulder. The pain lasted for 2 hours leading to ER presentation. No associated diaphoresis, dyspnea, nausea or vomiting. The pain is reproducible with palpation. Troponin x 2 negative. Electrolytes, kidney function and Hgb was normal. EKG showed NSR with RBBB. No acute findings. No prior EKG to compare. CXR clear.   He has another Tuesday night 12/26. She was sitting at that times. Less intensity and duration. No associated symptoms. She plays tennish 3 times/week for 2 hours without any dyspnea or chest pain. No family hx of CAD. Non smoker. Denies illicit drug use or recent travel.   Hx of epilepsy since 50 year old. Well controlled on medications. The patient denies nausea, vomiting, fever, palpitations, shortness of breath, orthopnea, PND, dizziness, syncope, cough, congestion, abdominal pain, hematochezia, melena, lower extremity edema.  Past Medical History:  Diagnosis Date  . 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 Migraine   . Seizures (HCC)     Past Surgical History:  Procedure Laterality Date  . EYE SURGERY  1970  . FOOT SURGERY Left 11/2010   stress fracture    Current Medications: Prior to Admission medications   Medication Sig Start Date End Date Taking? Authorizing Provider  carbamazepine (CARBATROL) 200 MG 12 hr capsule TAKE 2 CAPSULES BY MOUTH EVERY MORNING & 3 CAPSULES IN THE EVENING Patient taking differently: Take 400-600 mg by mouth 2 (two)  times daily. TAKE 3 CAPSULES BY MOUTH EVERY MORNING & 2 CAPSULES IN THE EVENING 10/18/16   Nilda RiggsNancy Carolyn Martin, NP  naproxen sodium (ANAPROX) 220 MG tablet Take 440 mg by mouth 2 (two) times daily with a meal.    Historical Provider, MD  SUMAtriptan (IMITREX) 100 MG tablet Take 100 mg by mouth every 2 (two) hours as needed for migraine (onset of migraine).    Historical Provider, MD    Allergies:   Penicillins   Social History   Social History  . Marital status: Married    Spouse name: Loraine LericheMark  . Number of children: 2  . Years of education: college   Occupational History  .  Unemployed   Social History Main Topics  . Smoking status: Never Smoker  . Smokeless tobacco: Never Used  . Alcohol use 1.0 - 1.5 oz/week    2 - 3 drink(s) per week     Comment: mixed drinks weekly   . Drug use: No  . Sexual activity: Not Asked   Other Topics Concern  . None   Social History Narrative   Patient has completed her masters in [employer] sciences in an online course.    Patient works at [employer].    Patient has been married for 18 years and has 2 children. Ages 4516 and 3711   Patient drinks 3 cups of coffee daily.      Family History:  The patient's family history includes Bladder Cancer in her paternal grandfather; Breast cancer in her mother; Prostate cancer in her  maternal grandfather.   ROS:   Please see the history of present illness.    ROS All other systems reviewed and are negative.   PHYSICAL EXAM:   VS:  BP 124/78 (BP Location: Left Arm, Patient Position: Sitting, Cuff Size: Normal)   Pulse (!) 56   Ht 5\' 11"  (1.803 m)   Wt 165 lb (74.8 kg)   BMI 23.01 kg/m    GEN: Well nourished, well developed, in no acute distress  HEENT: normal  Neck: no JVD, carotid bruits, or masses Cardiac: RRR; no murmurs, rubs, or gallops,no edema  Respiratory:  clear to auscultation bilaterally, normal work of breathing GI: soft, nontender, nondistended, + BS MS: no deformity or  atrophy  Skin: warm and dry, no rash Neuro:  Alert and Oriented x 3, Strength and sensation are intact Psych: euthymic mood, full affect  Wt Readings from Last 3 Encounters:  11/25/16 165 lb (74.8 kg)  11/14/16 158 lb (71.7 kg)  10/18/16 164 lb 12.8 oz (74.8 kg)      Studies/Labs Reviewed:   EKG:  EKG is ordered today.  The ekg ordered today demonstrates sinus bradycardia at rate of 56 bpm, incomplete RBBB.   Recent Labs: 10/18/2016: ALT 10 11/14/2016: BUN 19; Creatinine, Ser 0.71; Hemoglobin 12.5; Platelets 150; Potassium 3.5; Sodium 141   Lipid Panel No results found for: CHOL, TRIG, HDL, CHOLHDL, VLDL, LDLCALC, LDLDIRECT  Additional studies/ records that were reviewed today include:   As above    ASSESSMENT & PLAN:    1. Chest pain - Atypical. Work up in ER was negative except RBBB. 2 episodes so far. She plays tennish 3 times/week for the past 10 years without chest pain or shortness of breath. This is reassuring sign. ? Palpitation however less likely. Advised to get vital sign if another episode of chest pain. She will give us call if pulse is > 100 or BP >140/90. No cardiac risk factors. Try OTC PPI for possible GERD. She will follow up with PCP for non cardiac evaluation. Discussed plan with DOD Dr. Ladona Ridgelaylor he is aggress with this. If symptoms worsen, she will gives us call.   2. RBBB - No syncope or dizziness. No workup needed at this time.   Medication Adjustments/Labs and Tests Ordered: Current medicines are reviewed at length with the patient today.  Concerns regarding medicines are outlined above.  Medication changes, Labs and Tests ordered today are listed in the Patient Instructions below. Patient Instructions  Medication Instructions:  Your physician recommends that you continue on your current medications as directed. Please refer to the Current Medication list given to you today.   Labwork: NONE  Testing/Procedures: NONE  Follow-Up: 1. Follow up  with your PCP. 2. Follow up with cardiology as needed.  Any Other Special Instructions Will Be Listed Below (If Applicable).     If you need a refill on your cardiac medications before your next appointment, please call your pharmacy.     Lorelei PontSigned, Connor Foxworthy, [place]  11/25/2016 8:51 AM    [facility] [address], [place], [place]  [address] Phone: [phone]; Fax: [phone]

## 2016-11-23 ENCOUNTER — Encounter: Payer: Self-pay | Admitting: *Deleted

## 2016-11-25 ENCOUNTER — Ambulatory Visit (INDEPENDENT_AMBULATORY_CARE_PROVIDER_SITE_OTHER): Payer: BLUE CROSS/BLUE SHIELD | Admitting: Physician Assistant

## 2016-11-25 ENCOUNTER — Encounter: Payer: Self-pay | Admitting: Physician Assistant

## 2016-11-25 VITALS — BP 124/78 | HR 56 | Ht 71.0 in | Wt 165.0 lb

## 2016-11-25 DIAGNOSIS — R079 Chest pain, unspecified: Secondary | ICD-10-CM

## 2016-11-25 DIAGNOSIS — I4510 Unspecified right bundle-branch block: Secondary | ICD-10-CM | POA: Diagnosis not present

## 2016-11-25 NOTE — Patient Instructions (Signed)
Medication Instructions:  Your physician recommends that you continue on your current medications as directed. Please refer to the Current Medication list given to you today.   Labwork: NONE  Testing/Procedures: NONE  Follow-Up: 1. Follow up with your PCP. 2. Follow up with cardiology as needed.  Any Other Special Instructions Will Be Listed Below (If Applicable).     If you need a refill on your cardiac medications before your next appointment, please call your pharmacy.

## 2017-10-17 ENCOUNTER — Ambulatory Visit: Payer: BLUE CROSS/BLUE SHIELD | Admitting: Nurse Practitioner

## 2017-10-25 ENCOUNTER — Telehealth: Payer: Self-pay | Admitting: *Deleted

## 2017-10-25 NOTE — Telephone Encounter (Signed)
Received fax re: patient received influenza vaccine on 10/22/17. Lot [id number], [employer].

## 2017-11-17 ENCOUNTER — Other Ambulatory Visit: Payer: Self-pay | Admitting: Nurse Practitioner

## 2017-12-20 ENCOUNTER — Encounter: Payer: Self-pay | Admitting: Nurse Practitioner

## 2018-01-16 ENCOUNTER — Ambulatory Visit: Payer: BLUE CROSS/BLUE SHIELD | Admitting: Nurse Practitioner

## 2018-04-07 ENCOUNTER — Ambulatory Visit: Payer: BLUE CROSS/BLUE SHIELD | Admitting: Nurse Practitioner

## 2018-05-11 IMAGING — CR DG CHEST 2V
2 series · 2 of 2 positions shown · non-contrast
Comparison: None.

CLINICAL DATA: Left-sided chest pain

EXAM:
CHEST  2 VIEW

[w chest pa]
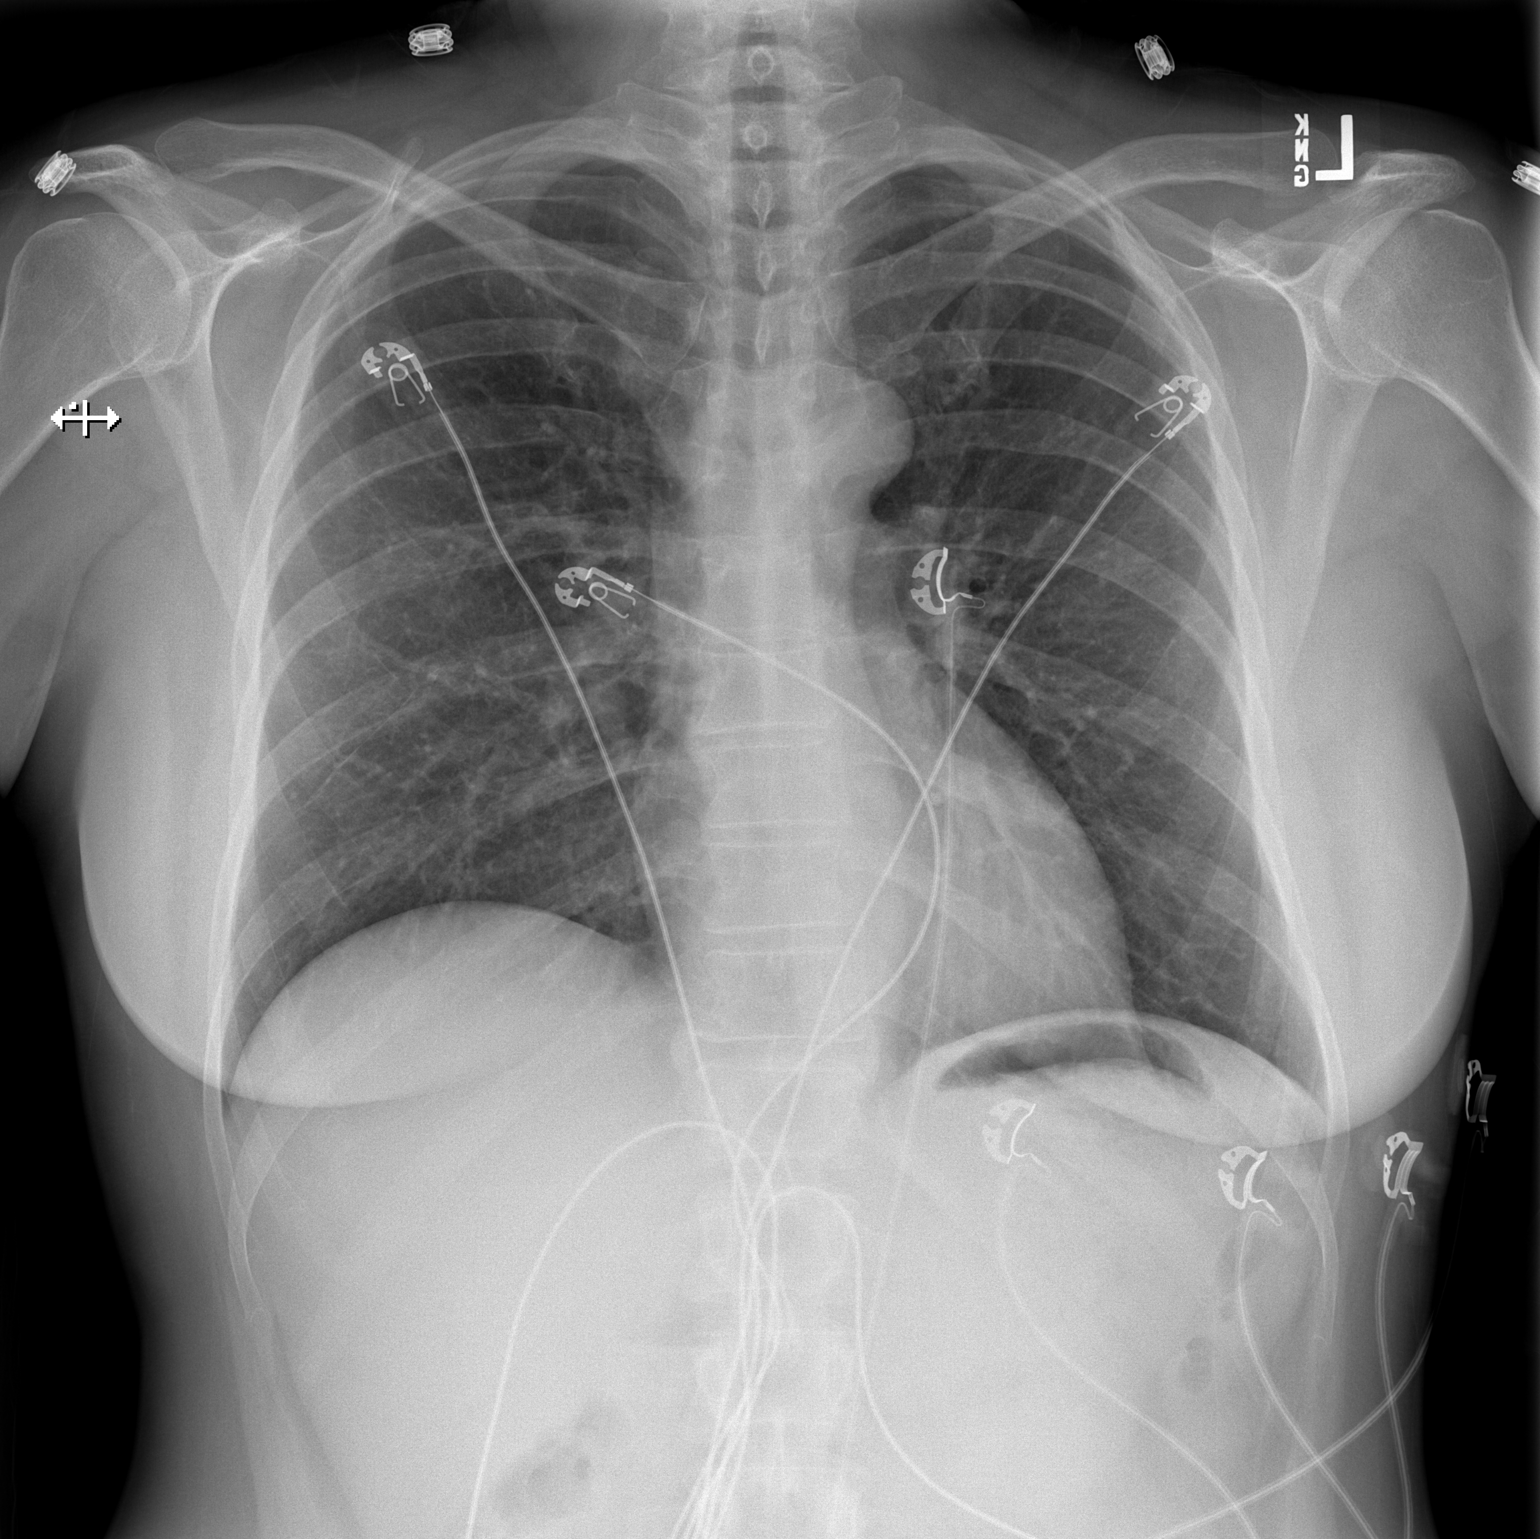

[w chest lat]
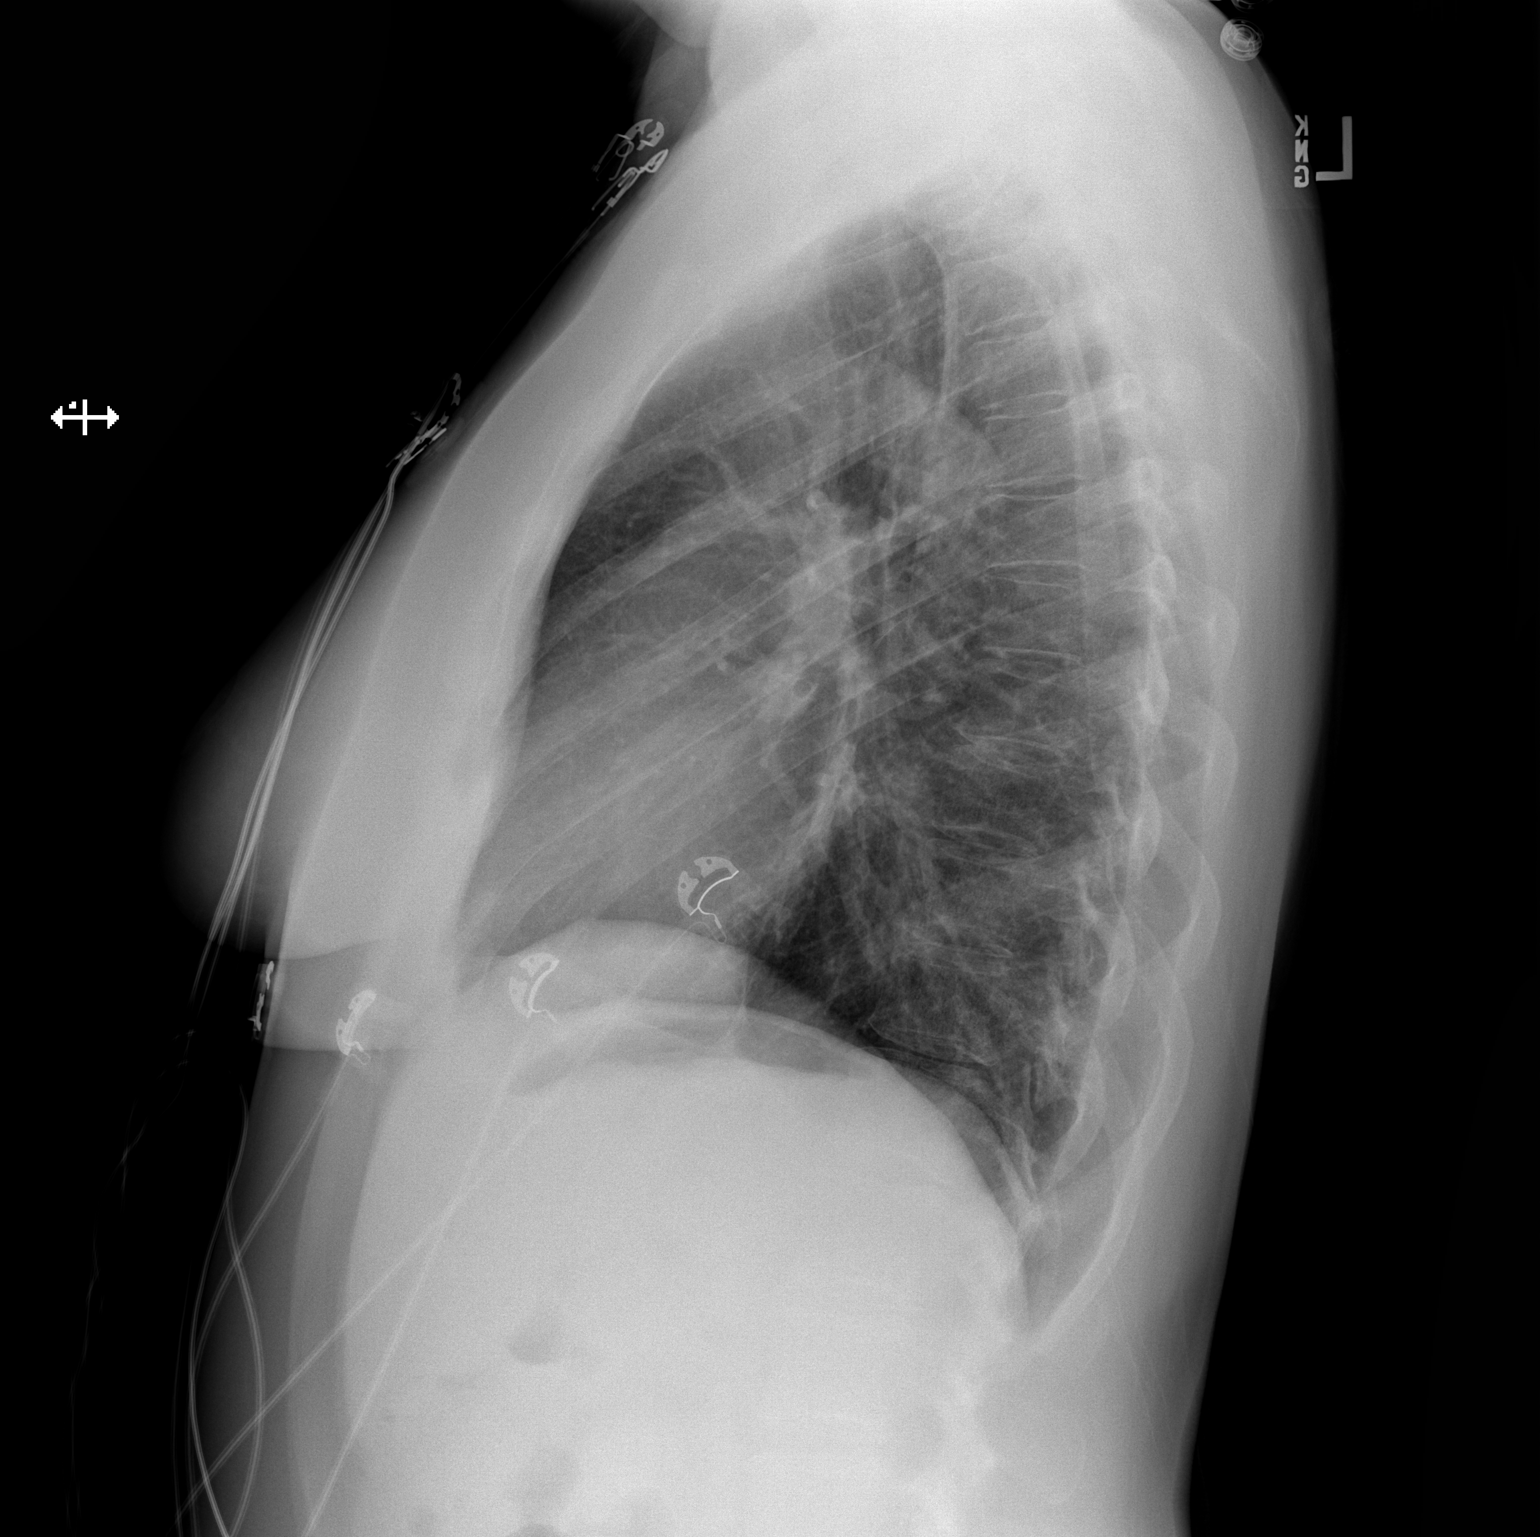

[2 of 2 positions shown; findings below may reference images not displayed]

FINDINGS: Cardiomediastinal contours are normal. No pneumothorax or pleural
effusion.

No focal airspace consolidation or pulmonary edema.
IMPRESSION: Clear lungs.

## 2018-07-11 NOTE — Progress Notes (Deleted)
[facility]  PATIENT: Amber Sanchez DOB: Jan 20, 1966   REASON FOR VISIT: Follow-up for seizure disorder, migraines HISTORY FROM: Patient    HISTORY OF PRESENT ILLNESS:Amber Sanchez is a 52 year old left-handed white female with a history of seizures.  The patient indicates that she first had a seizure around age 52. The patient has done quite well over time with the seizures, with the last seizure 10/26/2013 and prior to that 10 years earlier . The patient has been on  Carbatrol taking 400 mg in the morning and 600 mg at night.  The patient has been tolerating the medication well. Sleep deprivation is a seizure trigger for her and with her last seizure she  had been up late the night before shopping. The patient got only about 4 or 5 hours of sleep. The patient had no warning with the seizure, and had a generalized convulsive event associated with tongue biting. The patient did not injure herself with the seizure. The patient did not miss any doses of medications, and the blood levels of carbamazepine in the emergency room were slightly subtherapeutic at 3.8.   The patient also has a history of migraines but has not had a recent headache and has not had to use her Imitrex in quite some time.The patient comes to this office for an evaluation.    REVIEW OF SYSTEMS: Full 14 system review of systems performed and notable only for those listed, all others are neg:  Constitutional: neg  Cardiovascular: neg Ear/Nose/Throat: neg  Skin: neg Eyes: neg Respiratory: neg Gastroitestinal: neg  Hematology/Lymphatic: neg  Endocrine: neg Musculoskeletal:neg Allergy/Immunology: neg Neurological: neg Psychiatric: neg Sleep : neg   ALLERGIES: Allergies  Allergen Reactions  . Penicillins     Childhood, unknown Has patient had a PCN reaction causing immediate rash, facial/tongue/throat swelling, SOB or lightheadedness with hypotension: unknown Has patient had a PCN reaction  causing severe rash involving mucus membranes or skin necrosis: unknown Has patient had a PCN reaction that required hospitalization: unknown Has patient had a PCN reaction occurring within the last 10 years: no If all of the above answers are "NO", then may proceed with Cephalosporin use.    HOME MEDICATIONS: Outpatient Medications Prior to Visit  Medication Sig Dispense Refill  . carbamazepine (CARBATROL) 200 MG 12 hr capsule TAKE 2 CAPSULES BY MOUTH EVERY MORNING & 3 CAPSULES IN THE EVENING 150 capsule 11  . naproxen sodium (ANAPROX) 220 MG tablet Take 440 mg by mouth 2 (two) times daily with a meal.    . SUMAtriptan (IMITREX) 100 MG tablet Take 100 mg by mouth every 2 (two) hours as needed for migraine (onset of migraine).     No facility-administered medications prior to visit.     PAST MEDICAL HISTORY: Past Medical History:  Diagnosis Date  . 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 Migraine   . Seizures (HCC)     PAST SURGICAL HISTORY: Past Surgical History:  Procedure Laterality Date  . EYE SURGERY  1970  . FOOT SURGERY Left 11/2010   stress fracture    FAMILY HISTORY: Family History  Problem Relation Age of Onset  . Breast cancer Mother   . Prostate cancer Maternal Grandfather   . Bladder Cancer Paternal Grandfather   . Stroke Unknown        one grandmother   . High blood pressure Unknown        one grandmother   . Seizures Neg Hx  SOCIAL HISTORY: Social History   Socioeconomic History  . Marital status: Married    Spouse name: Loraine LericheMark  . Number of children: 2  . Years of education: college  . Highest education level: Not on file  Occupational History    Employer: UNEMPLOYED  Social Needs  . Financial resource strain: Not on file  . Food insecurity:    Worry: Not on file    Inability: Not on file  . Transportation needs:    Medical: Not on file    Non-medical: Not on file  Tobacco Use  . Smoking  status: Never Smoker  . Smokeless tobacco: Never Used  Substance and Sexual Activity  . Alcohol use: Yes    Alcohol/week: 2.0 - 3.0 standard drinks    Types: 2 - 3 drink(s) per week    Comment: mixed drinks weekly   . Drug use: No  . Sexual activity: Not on file  Lifestyle  . Physical activity:    Days per week: Not on file    Minutes per session: Not on file  . Stress: Not on file  Relationships  . Social connections:    Talks on phone: Not on file    Gets together: Not on file    Attends religious service: Not on file    Active member of club or organization: Not on file    Attends meetings of clubs or organizations: Not on file    Relationship status: Not on file  . Intimate partner violence:    Fear of current or ex partner: Not on file    Emotionally abused: Not on file    Physically abused: Not on file    Forced sexual activity: Not on file  Other Topics Concern  . Not on file  Social History Narrative   Patient has completed her masters in [employer] sciences in an online course.    Patient works at [employer].    Patient has been married for 18 years and has 2 children. Ages 6916 and 3711   Patient drinks 3 cups of coffee daily.      PHYSICAL EXAM  There were no vitals filed for this visit. There is no height or weight on file to calculate BMI. General: The patient is alert and cooperative at the time of the examination. Skin: No significant peripheral edema is noted. Neurologic Exam Mental status: The patient is oriented x 3. Cranial nerves: Facial symmetry is present. Speech is normal, no aphasia or dysarthria is noted. Extraocular movements are full. Visual fields are full. Motor: The patient has good strength in all 4 extremities. no focal weakness Sensory examination: Soft touch sensation on the face, arms, and legs is symmetric. Coordination: The patient has good finger-nose-finger and heel-to-shin bilaterally. Gait and station: The patient has a  normal gait. Tandem gait is normal. Romberg is negative. No drift is seen. Reflexes: Deep tendon reflexes are symmetric.   DIAGNOSTIC DATA (LABS, IMAGING, TESTING) -   ASSESSMENT AND PLAN  52 y.o. year old female  has a past medical history of Migraine; Seizures; and Generalized convulsive epilepsy without mention of intractable epilepsy (11/01/2013). here  to follow-up. She is currently on Imitrex for her migraine and Carbatrol for her seizure disorder.  Continue Carbatrol at current dose will refill for one year Check labs today, CBC, CMP and CBZ level Call for any seizure activity Follow-up yearly and when necessary Nilda RiggsNancy Carolyn Kortne All, [facility] Hospital[facility]NP, [facility], APRN  [facility] [address],  [place], [place] [address] [phone]

## 2018-07-12 ENCOUNTER — Ambulatory Visit: Payer: BLUE CROSS/BLUE SHIELD | Admitting: Nurse Practitioner

## 2019-02-22 ENCOUNTER — Other Ambulatory Visit: Payer: Self-pay | Admitting: Neurology

## 2019-04-24 ENCOUNTER — Telehealth: Payer: Self-pay | Admitting: Neurology

## 2019-04-24 ENCOUNTER — Other Ambulatory Visit: Payer: Self-pay | Admitting: Neurology

## 2019-04-24 MED ORDER — CARBAMAZEPINE ER 200 MG PO CP12: ORAL_CAPSULE | ORAL | 0 refills | Status: DC

## 2019-04-24 NOTE — Telephone Encounter (Signed)
Pt called stating that her pharmacy informed her that she would need to schedule an appt with her Neurologist in order for her carbamazepine (CARBATROL) 200 MG 12 hr capsule can be refilled. Pt scheduled an appt with NP but will not have enough medication to cover her until the appt day. Please advise.

## 2019-04-24 NOTE — Telephone Encounter (Signed)
30 day rx has been provided pending appt with NP.

## 2019-04-25 ENCOUNTER — Telehealth: Payer: Self-pay | Admitting: *Deleted

## 2019-04-25 NOTE — Telephone Encounter (Signed)
Due to current COVID 19 pandemic, our office is severely reducing in office visits until further notice, in order to minimize the risk to our patients and healthcare providers. Unable to get in contact with the patient to convert their office visit with Maralyn Sago 6/1//2020 into a doxy.me visit. I left a voicemail asking the patient to return my call. Office number was provided.     If patient calls back please convert their office visit into a doxy.me visit.

## 2019-04-30 ENCOUNTER — Ambulatory Visit (INDEPENDENT_AMBULATORY_CARE_PROVIDER_SITE_OTHER): Payer: Self-pay | Admitting: Neurology

## 2019-04-30 ENCOUNTER — Encounter: Payer: Self-pay | Admitting: Neurology

## 2019-04-30 ENCOUNTER — Other Ambulatory Visit: Payer: Self-pay

## 2019-04-30 DIAGNOSIS — G40309 Generalized idiopathic epilepsy and epileptic syndromes, not intractable, without status epilepticus: Secondary | ICD-10-CM

## 2019-04-30 DIAGNOSIS — G43809 Other migraine, not intractable, without status migrainosus: Secondary | ICD-10-CM

## 2019-04-30 MED ORDER — SUMATRIPTAN SUCCINATE 50 MG PO TABS: 50.0000 mg | ORAL_TABLET | ORAL | 3 refills | Status: DC | PRN

## 2019-04-30 NOTE — Progress Notes (Signed)
I have read the note, and I agree with the clinical assessment and plan.  Charles K Willis   

## 2019-04-30 NOTE — Progress Notes (Signed)
Virtual Visit via Video Note  I connected with Amber Sanchez on 04/30/19 at  8:45 AM EDT by a video enabled telemedicine application and verified that I am speaking with the correct person using two identifiers.  Location: Patient:  At her home  Provider: In the office    I discussed the limitations of evaluation and management by telemedicine and the availability of in person appointments. The patient expressed understanding and agreed to proceed.  History of Present Illness: 04/30/2019 SS: Amber Sanchez is a 53 year old female with history of seizures.  She began having seizures around the age of 73, her last seizure was in 2014.  She is currently taking Carbatrol 400 mg in the morning and 600 mg at night.  She reports compliance with the medication and is tolerating medication well.  She has not had any recurrent seizures.  She reports sleep deprivation is a trigger for seizures.  She denies any changes to her health condition.  She reports she is having about 1 migraine per month, it may last a few days.  She is currently out of her Imitrex, she has taken this in the past and has been beneficial.  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  Weather changes are also a trigger for her migraine headaches.  She says she sees her primary care doctor on an annual basis, has had a good report. She was lost to follow-up from 2017-present.   10/18/2016 CM: Amber Sanchez is a 53 year old left-handed white female with a history of seizures.  The patient indicates that she first had a seizure around age 59. The patient has done quite well over time with the seizures, with the last seizure 10/26/2013 and prior to that 10 years earlier . The patient has been on  Carbatrol taking 400 mg in the morning and 600 mg at night.  The patient has been tolerating the medication well. Sleep deprivation is a seizure trigger for her and  with her last seizure she  had been up late the night before shopping. The patient got only about 4 or 5 hours of sleep. The patient had no warning with the seizure, and had a generalized convulsive event associated with tongue biting. The patient did not injure herself with the seizure. The patient did not miss any doses of medications, and the blood levels of carbamazepine in the emergency room were slightly subtherapeutic at 3.8.   The patient also has a history of migraines but has not had a recent headache and has not had to use her Imitrex in quite some time.The patient comes to this office for an evaluation.   Observations/Objective: Alert, answers questions appropriately, follows commands, speech is clear and concise, facial symmetry noted, no problems with her gait  Assessment and Plan: 1.  Seizure disorder 2.  Headaches  She has not had any recurrent seizures.  I will check blood work.  She will come to the office in the next 1-2 weeks to have blood work done.  Once the labs result, I will send in a refill of her Carbatrol.  She is currently taking Carbatrol 200 mg 12 hour capsules, 2 capsules in the morning and 3 capsules in the evening.  She reports that she is having about 1 migraine headache per month, but it could last several days. In the past she has taken Imitrex, but it is been several years  since she has had this filled.  I will send in a new prescription for Imitrex 50 mg tablets as this was beneficial in the past. She has not had any changes to her medical history.  Her headaches are also related to school being in session, as she is a Engineer, siteschool teacher. She says during the summer she does quite well.  We may consider starting preventative medication in the future if her migraines increase in frequency.   Follow Up Instructions: 6 months with Dr. Anne HahnWillis    I discussed the assessment and treatment plan with the patient. The patient was provided an opportunity to ask questions and all  were answered. The patient agreed with the plan and demonstrated an understanding of the instructions.   The patient was advised to call back or seek an in-person evaluation if the symptoms worsen or if the condition fails to improve as anticipated.  I provided 15 minutes of non-face-to-face time during this encounter.   Otila KluverSarah Thamas Appleyard, AGNP-C, DNP  [facility] [address], [address] [place], [place] [id number] [phone]

## 2019-04-30 NOTE — Telephone Encounter (Signed)
Pt returned call , consent was given for Doxy.me visit and link was sent through text message , pt also provided consent to bill insurance.

## 2019-04-30 NOTE — Telephone Encounter (Signed)
Noted  

## 2019-05-17 ENCOUNTER — Other Ambulatory Visit: Payer: Self-pay | Admitting: Neurology

## 2019-05-17 NOTE — Telephone Encounter (Signed)
Per Judson Roch, NP's note, pt should have her carbamazepine levels checked before pt's carbatrol is refilled. I called pt to remind her to come to the office for blood work. No answer, left a message asking her to call us back. If pt calls back, please remind her of this.

## 2019-06-08 ENCOUNTER — Other Ambulatory Visit: Payer: Self-pay | Admitting: Neurology

## 2019-06-18 ENCOUNTER — Other Ambulatory Visit (INDEPENDENT_AMBULATORY_CARE_PROVIDER_SITE_OTHER): Payer: Self-pay

## 2019-06-18 ENCOUNTER — Other Ambulatory Visit: Payer: Self-pay

## 2019-06-18 DIAGNOSIS — Z0289 Encounter for other administrative examinations: Secondary | ICD-10-CM

## 2019-06-18 DIAGNOSIS — G40309 Generalized idiopathic epilepsy and epileptic syndromes, not intractable, without status epilepticus: Secondary | ICD-10-CM

## 2019-06-19 ENCOUNTER — Telehealth: Payer: Self-pay | Admitting: *Deleted

## 2019-06-19 ENCOUNTER — Other Ambulatory Visit: Payer: Self-pay | Admitting: Neurology

## 2019-06-19 LAB — COMPREHENSIVE METABOLIC PANEL
ALT: 12 IU/L (ref 0–32)
AST: 15 IU/L (ref 0–40)
Albumin/Globulin Ratio: 1.7 (ref 1.2–2.2)
Albumin: 4.3 g/dL (ref 3.8–4.9)
Alkaline Phosphatase: 115 IU/L (ref 39–117)
BUN/Creatinine Ratio: 23 (ref 9–23)
BUN: 16 mg/dL (ref 6–24)
Bilirubin Total: 0.3 mg/dL (ref 0.0–1.2)
CO2: 23 mmol/L (ref 20–29)
Calcium: 9.2 mg/dL (ref 8.7–10.2)
Chloride: 102 mmol/L (ref 96–106)
Creatinine, Ser: 0.71 mg/dL (ref 0.57–1.00)
GFR calc Af Amer: 112 mL/min/{1.73_m2} (ref >59)
GFR calc non Af Amer: 98 mL/min/{1.73_m2} (ref >59)
Globulin, Total: 2.5 g/dL (ref 1.5–4.5)
Glucose: 102 mg/dL — ABNORMAL HIGH (ref 65–99)
Potassium: 4.9 mmol/L (ref 3.5–5.2)
Sodium: 142 mmol/L (ref 134–144)
Total Protein: 6.8 g/dL (ref 6.0–8.5)

## 2019-06-19 LAB — CBC WITH DIFFERENTIAL/PLATELET
Basophils Absolute: 0.1 10*3/uL (ref 0.0–0.2)
Basos: 1 %
EOS (ABSOLUTE): 0.1 10*3/uL (ref 0.0–0.4)
Eos: 3 %
Hematocrit: 39 % (ref 34.0–46.6)
Hemoglobin: 13.1 g/dL (ref 11.1–15.9)
Immature Grans (Abs): 0 10*3/uL (ref 0.0–0.1)
Immature Granulocytes: 0 %
Lymphocytes Absolute: 1.8 10*3/uL (ref 0.7–3.1)
Lymphs: 36 %
MCH: 30.5 pg (ref 26.6–33.0)
MCHC: 33.6 g/dL (ref 31.5–35.7)
MCV: 91 fL (ref 79–97)
Monocytes Absolute: 0.5 10*3/uL (ref 0.1–0.9)
Monocytes: 9 %
Neutrophils Absolute: 2.6 10*3/uL (ref 1.4–7.0)
Neutrophils: 51 %
Platelets: 175 10*3/uL (ref 150–450)
RBC: 4.29 x10E6/uL (ref 3.77–5.28)
RDW: 12.4 % (ref 11.7–15.4)
WBC: 5 10*3/uL (ref 3.4–10.8)

## 2019-06-19 LAB — CARBAMAZEPINE LEVEL, TOTAL: Carbamazepine (Tegretol), S: 6.6 ug/mL (ref 4.0–12.0)

## 2019-06-19 MED ORDER — CARBAMAZEPINE ER 200 MG PO CP12: ORAL_CAPSULE | ORAL | 5 refills | Status: DC

## 2019-06-19 NOTE — Telephone Encounter (Signed)
Mychart message sent to relay lab results as per SS/NP.

## 2019-06-19 NOTE — Telephone Encounter (Signed)
See other note

## 2019-06-19 NOTE — Telephone Encounter (Signed)
-----   Message from Suzzanne Cloud, NP sent at 06/19/2019  8:03 AM EDT ----- Please call the patient. Labs look good. Good level of carbamazepine. I will send a refill of her medication.

## 2019-07-14 ENCOUNTER — Other Ambulatory Visit: Payer: Self-pay | Admitting: Neurology

## 2019-12-13 ENCOUNTER — Other Ambulatory Visit: Payer: Self-pay | Admitting: Neurology

## 2019-12-13 NOTE — Telephone Encounter (Signed)
Carbamazepine refilled x 3 months with note to pharmacy: she needs to call and schedule follow up with Dr Anne Hahn.

## 2020-08-09 ENCOUNTER — Other Ambulatory Visit: Payer: Self-pay | Admitting: Neurology

## 2020-10-13 ENCOUNTER — Other Ambulatory Visit: Payer: Self-pay | Admitting: Neurology

## 2020-11-10 ENCOUNTER — Other Ambulatory Visit: Payer: Self-pay | Admitting: Neurology

## 2020-11-26 ENCOUNTER — Telehealth: Payer: Self-pay | Admitting: Neurology

## 2020-11-26 MED ORDER — CARBAMAZEPINE ER 200 MG PO CP12: ORAL_CAPSULE | ORAL | 2 refills | Status: DC

## 2020-11-26 NOTE — Telephone Encounter (Signed)
Pt is needing a refill on her carbamazepine (CARBATROL) 200 MG 12 hr capsule sent in to the [facility] on [address].

## 2021-02-04 NOTE — Progress Notes (Signed)
PATIENT: Amber Sanchez DOB: Jul 05, 1966  REASON FOR VISIT: follow up HISTORY FROM: patient  HISTORY OF PRESENT ILLNESS: Today 02/05/21 Ms. Amber Sanchez is a 55 year old female with history of seizures.  Last seizure was in 2014.  She remains on Carbatrol.  She takes Imitrex for migraine relief.  On average 1 migraine a month, could last a few days.  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  Imitrex works well.  She can go all summer without a migraine.  Typical seizures is generalized.  Needs a refill on Imitrex.  Here today for evaluation unaccompanied.  HISTORY 04/30/2019 SS: Ms. Amber Sanchez is a 55 year old female with history of seizures.  She began having seizures around the age of 72, her last seizure was in 2014.  She is currently taking Carbatrol 400 mg in the morning and 600 mg at night.  She reports compliance with the medication and is tolerating medication well.  She has not had any recurrent seizures.  She reports sleep deprivation is a trigger for seizures.  She denies any changes to her health condition.  She reports she is having about 1 migraine per month, it may last a few days.  She is currently out of her Imitrex, she has taken this in the past and has been beneficial.  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  Weather changes are also a trigger for her migraine headaches.  She says she sees her primary care doctor on an annual basis, has had a good report. She was lost to follow-up from 2017-present.    REVIEW OF SYSTEMS: Out of a complete 14 system review of symptoms, the patient complains only of the following symptoms, and all other reviewed systems are negative.  N/A  ALLERGIES: Allergies  Allergen Reactions  . Penicillins     Childhood, unknown Has patient had a PCN  reaction causing immediate rash, facial/tongue/throat swelling, SOB or lightheadedness with hypotension: unknown Has patient had a PCN reaction causing severe rash involving mucus membranes or skin necrosis: unknown Has patient had a PCN reaction that required hospitalization: unknown Has patient had a PCN reaction occurring within the last 10 years: no If all of the above answers are "NO", then may proceed with Cephalosporin use.    HOME MEDICATIONS: Outpatient Medications Prior to Visit  Medication Sig Dispense Refill  . naproxen sodium (ANAPROX) 220 MG tablet Take 440 mg by mouth 2 (two) times daily with a meal.    . carbamazepine (CARBATROL) 200 MG 12 hr capsule Take 2 capusles in AM and 3 capsules in PM 150 capsule 2  . SUMAtriptan (IMITREX) 50 MG tablet Take 1 tablet (50 mg total) by mouth every 2 (two) hours as needed for migraine. Take 1 tab at the onset of migraine, may repeat in 2 hours, if needed, max dose is 2 tabs/day. This is a 30-day prescription 10 tablet 3   No facility-administered medications prior to visit.    PAST MEDICAL HISTORY: Past Medical History:  Diagnosis Date  . 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 Migraine   . Seizures (HCC)     PAST SURGICAL HISTORY: Past Surgical History:  Procedure Laterality Date  . EYE SURGERY  1970  . FOOT SURGERY Left 11/2010   stress fracture  FAMILY HISTORY: Family History  Problem Relation Age of Onset  . Breast cancer Mother   . Prostate cancer Maternal Grandfather   . Bladder Cancer Paternal Grandfather   . Stroke Unknown        one grandmother   . High blood pressure Unknown        one grandmother   . Seizures Neg Hx     SOCIAL HISTORY: Social History   Socioeconomic History  . Marital status: Married    Spouse name: Loraine Leriche  . Number of children: 2  . Years of education: college  . Highest education level: Not on file  Occupational History    Employer:  UNEMPLOYED  Tobacco Use  . Smoking status: Never Smoker  . Smokeless tobacco: Never Used  Substance and Sexual Activity  . Alcohol use: Yes    Alcohol/week: 2.0 - 3.0 standard drinks    Types: 2 - 3 drink(s) per week    Comment: mixed drinks weekly   . Drug use: No  . Sexual activity: Not on file  Other Topics Concern  . Not on file  Social History Narrative   Patient has completed her masters in [employer] in an online course.    Patient works at [employer].    Patient has been married for 18 years and has 2 children. Ages 30 and 43   Patient drinks 3 cups of coffee daily.    Social Determinants of Health   Financial Resource Strain: Not on file  Food Insecurity: Not on file  Transportation Needs: Not on file  Physical Activity: Not on file  Stress: Not on file  Social Connections: Not on file  Intimate Partner Violence: Not on file   PHYSICAL EXAM  Vitals:   02/05/21 0746  BP: 123/65  Pulse: 66  Weight: 157 lb (71.2 kg)  Height: 5\' 11"  (1.803 m)   Body mass index is 21.9 kg/m.  Generalized: Well developed, in no acute distress   Neurological examination  Mentation: Alert oriented to time, place, history taking. Follows all commands speech and language fluent Cranial nerve II-XII: Pupils were equal round reactive to light. Extraocular movements were full, visual field were full on confrontational test. Facial sensation and strength were normal. Head turning and shoulder shrug  were normal and symmetric. Motor: The motor testing reveals 5 over 5 strength of all 4 extremities. Good symmetric motor tone is noted throughout.  Sensory: Sensory testing is intact to soft touch on all 4 extremities. No evidence of extinction is noted.  Coordination: Cerebellar testing reveals good finger-nose-finger and heel-to-shin bilaterally.  Gait and station: Gait is normal.  Reflexes: Deep tendon reflexes are symmetric and normal bilaterally.   DIAGNOSTIC DATA  (LABS, IMAGING, TESTING) - I reviewed patient records, labs, notes, testing and imaging myself where available.  Lab Results  Component Value Date   WBC 5.0 06/18/2019   HGB 13.1 06/18/2019   HCT 39.0 06/18/2019   MCV 91 06/18/2019   PLT 175 06/18/2019      Component Value Date/Time   NA 142 06/18/2019 1034   K 4.9 06/18/2019 1034   CL 102 06/18/2019 1034   CO2 23 06/18/2019 1034   GLUCOSE 102 (H) 06/18/2019 1034   GLUCOSE 119 (H) 11/14/2016 2050   BUN 16 06/18/2019 1034   CREATININE 0.71 06/18/2019 1034   CALCIUM 9.2 06/18/2019 1034   PROT 6.8 06/18/2019 1034   ALBUMIN 4.3 06/18/2019 1034   AST 15 06/18/2019 1034  ALT 12 06/18/2019 1034   ALKPHOS 115 06/18/2019 1034   BILITOT 0.3 06/18/2019 1034   GFRNONAA 98 06/18/2019 1034   GFRAA 112 06/18/2019 1034   No results found for: CHOL, HDL, LDLCALC, LDLDIRECT, TRIG, CHOLHDL No results found for: WLNL8X No results found for: VITAMINB12 No results found for: TSH  ASSESSMENT AND PLAN 55 y.o. 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 here with:  1.  Seizure disorder -Last seizure in 2014 -Continue Carbatrol 200 mg, 2 capsules AM, 3 capsules PM -Check routine labs  2.  Headaches -Continue Imitrex 50 mg as needed for acute headache -Follow-up in 1 year or sooner if needed  I spent 20 minutes of face-to-face and non-face-to-face time with patient.  This included previsit chart review, lab review, study review, order entry, electronic health record documentation, patient education.  Margie Ege, AGNP-C, DNP 02/05/2021, 8:18 AM [facility] [address] [place], [place] [address] [phone]

## 2021-02-05 ENCOUNTER — Encounter: Payer: Self-pay | Admitting: Neurology

## 2021-02-05 ENCOUNTER — Other Ambulatory Visit: Payer: Self-pay

## 2021-02-05 ENCOUNTER — Ambulatory Visit: Payer: BC Managed Care – PPO | Admitting: Neurology

## 2021-02-05 VITALS — BP 123/65 | HR 66 | Ht 71.0 in | Wt 157.0 lb

## 2021-02-05 DIAGNOSIS — G43809 Other migraine, not intractable, without status migrainosus: Secondary | ICD-10-CM

## 2021-02-05 DIAGNOSIS — G40309 Generalized idiopathic epilepsy and epileptic syndromes, not intractable, without status epilepticus: Secondary | ICD-10-CM | POA: Diagnosis not present

## 2021-02-05 MED ORDER — CARBAMAZEPINE ER 200 MG PO CP12: ORAL_CAPSULE | ORAL | 4 refills | Status: DC

## 2021-02-05 MED ORDER — SUMATRIPTAN SUCCINATE 50 MG PO TABS: 50.0000 mg | ORAL_TABLET | ORAL | 5 refills | Status: DC | PRN

## 2021-02-05 NOTE — Patient Instructions (Signed)
You look great! Continue current medications Call for increase in headaches  See you back 1 year or sooner if needed

## 2021-02-06 LAB — COMPREHENSIVE METABOLIC PANEL
ALT: 17 IU/L (ref 0–32)
AST: 21 IU/L (ref 0–40)
Albumin/Globulin Ratio: 2 (ref 1.2–2.2)
Albumin: 4.9 g/dL (ref 3.8–4.9)
Alkaline Phosphatase: 104 IU/L (ref 44–121)
BUN/Creatinine Ratio: 26 — ABNORMAL HIGH (ref 9–23)
BUN: 18 mg/dL (ref 6–24)
Bilirubin Total: 0.2 mg/dL (ref 0.0–1.2)
CO2: 28 mmol/L (ref 20–29)
Calcium: 9.5 mg/dL (ref 8.7–10.2)
Chloride: 98 mmol/L (ref 96–106)
Creatinine, Ser: 0.7 mg/dL (ref 0.57–1.00)
Globulin, Total: 2.4 g/dL (ref 1.5–4.5)
Glucose: 79 mg/dL (ref 65–99)
Potassium: 4.7 mmol/L (ref 3.5–5.2)
Sodium: 139 mmol/L (ref 134–144)
Total Protein: 7.3 g/dL (ref 6.0–8.5)
eGFR: 103 mL/min/{1.73_m2} (ref >59)

## 2021-02-06 LAB — CBC WITH DIFFERENTIAL/PLATELET
Basophils Absolute: 0.1 10*3/uL (ref 0.0–0.2)
Basos: 1 %
EOS (ABSOLUTE): 0.1 10*3/uL (ref 0.0–0.4)
Eos: 3 %
Hematocrit: 43.2 % (ref 34.0–46.6)
Hemoglobin: 14.7 g/dL (ref 11.1–15.9)
Immature Grans (Abs): 0 10*3/uL (ref 0.0–0.1)
Immature Granulocytes: 0 %
Lymphocytes Absolute: 2.1 10*3/uL (ref 0.7–3.1)
Lymphs: 42 %
MCH: 30.9 pg (ref 26.6–33.0)
MCHC: 34 g/dL (ref 31.5–35.7)
MCV: 91 fL (ref 79–97)
Monocytes Absolute: 0.4 10*3/uL (ref 0.1–0.9)
Monocytes: 8 %
Neutrophils Absolute: 2.3 10*3/uL (ref 1.4–7.0)
Neutrophils: 46 %
Platelets: 190 10*3/uL (ref 150–450)
RBC: 4.76 x10E6/uL (ref 3.77–5.28)
RDW: 12.1 % (ref 11.7–15.4)
WBC: 5 10*3/uL (ref 3.4–10.8)

## 2021-02-06 LAB — CARBAMAZEPINE LEVEL, TOTAL: Carbamazepine (Tegretol), S: 7.8 ug/mL (ref 4.0–12.0)

## 2021-02-08 NOTE — Progress Notes (Signed)
I have read the note, and I agree with the clinical assessment and plan.  Marika Mahaffy K Filomeno Cromley   

## 2021-02-09 ENCOUNTER — Telehealth: Payer: Self-pay

## 2021-02-09 NOTE — Telephone Encounter (Signed)
-----   Message from Glean Salvo, NP sent at 02/09/2021  5:48 AM EDT ----- Sent my chart message:  Angie, Blood work is unremarkable. Great to see you last week! Let me know if you need anything!  Take Care,  Maralyn Sago

## 2021-04-20 ENCOUNTER — Other Ambulatory Visit: Payer: Self-pay | Admitting: Neurology

## 2022-02-10 ENCOUNTER — Ambulatory Visit: Payer: BC Managed Care – PPO | Admitting: Neurology

## 2022-04-08 ENCOUNTER — Other Ambulatory Visit: Payer: Self-pay | Admitting: Neurology

## 2022-04-08 NOTE — Telephone Encounter (Signed)
Rx refilled. Note added to pharmacy. Pt to schedule yearly f/u for future refills. ?

## 2023-04-11 ENCOUNTER — Encounter: Payer: Self-pay | Admitting: Psychiatry

## 2023-04-11 ENCOUNTER — Other Ambulatory Visit: Payer: Self-pay | Admitting: Neurology

## 2023-04-11 ENCOUNTER — Telehealth: Payer: Self-pay | Admitting: Neurology

## 2023-04-11 MED ORDER — CARBAMAZEPINE ER 200 MG PO CP12: ORAL_CAPSULE | ORAL | 0 refills | Status: DC

## 2023-04-11 NOTE — Telephone Encounter (Signed)
See telephone encounter from 04/11/23.

## 2023-04-11 NOTE — Telephone Encounter (Signed)
Pt has called asking for a refill on her carBAMazepine 200 MG  , Pt schedule an appointment and is on wait list as well. Pt asked if just enough of the medication could be called in until her appointment.  Pt was reminded that she has not been seen in over 2 years, it was explained that she will have to be seen before any medication could be called in.  The suggestion of pt reaching out to her PCP was made.  Pt used the Explicit F word, phone rep then ended the call.  This is FYI to POD 2 and Maralyn Sago, NP(the appointment was scheduled with Dr Delena Bali since pt last MD was Dr Anne Hahn, this was an attempt to get pt in as soon as possible.)

## 2023-04-18 NOTE — Progress Notes (Unsigned)
PATIENT: Amber Sanchez DOB: 1966/04/22  REASON FOR VISIT: follow up HISTORY FROM: patient PRIMARY NEUROLOGIST: [facility]  HISTORY OF PRESENT ILLNESS: Today 04/19/23 Here today for follow-up.  Doing well remains on generic Carbatrol 400/600 daily.  No seizures since 2014.  Her seizures have been generalized.  Started when she was in college.  Had several in college before they figured out seizures.  Since she has been medicated she has had about 3-4 in her lifetime.  Previously discussed with Dr. Sharene Skeans coming off medication, decided to stay on since seizures are relatively infrequent but are significant when occur.  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  Migraines are infrequent.  Has not needed Imitrex in the last several months.  Switching to a new PCP.  Update 02/05/21 SS: Amber Sanchez is a 57 year old female with history of seizures.  Last seizure was in 2014.  She remains on Carbatrol.  She takes Imitrex for migraine relief.  On average 1 migraine a month, could last a few days.  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  Imitrex works well.  She can go all summer without a migraine.  Typical seizures is generalized.  Needs a refill on Imitrex.  Here today for evaluation unaccompanied.  HISTORY 04/30/2019 SS: Amber Sanchez is a 57 year old female with history of seizures.  She began having seizures around the age of 71, her last seizure was in 2014.  She is currently taking Carbatrol 400 mg in the morning and 600 mg at night.  She reports compliance with the medication and is tolerating medication well.  She has not had any recurrent seizures.  She reports sleep deprivation is a trigger for seizures.  She denies any changes to her health condition.  She reports she is having about 1 migraine per month, it may last a few days.  She is  currently out of her Imitrex, she has taken this in the past and has been beneficial.  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  Weather changes are also a trigger for her migraine headaches.  She says she sees her primary care doctor on an annual basis, has had a good report. She was lost to follow-up from 2017-present.    REVIEW OF SYSTEMS: Out of a complete 14 system review of symptoms, the patient complains only of the following symptoms, and all other reviewed systems are negative.  N/A  ALLERGIES: Allergies  Allergen Reactions   Penicillins     Childhood, unknown Has patient had a PCN reaction causing immediate rash, facial/tongue/throat swelling, SOB or lightheadedness with hypotension: unknown Has patient had a PCN reaction causing severe rash involving mucus membranes or skin necrosis: unknown Has patient had a PCN reaction that required hospitalization: unknown Has patient had a PCN reaction occurring within the last 10 years: no If all of the above answers are "NO", then may proceed with Cephalosporin use.    HOME MEDICATIONS: Outpatient Medications Prior to Visit  Medication Sig Dispense Refill   naproxen sodium (ANAPROX) 220 MG tablet Take 440 mg by mouth 2 (two) times daily with a meal.     carbamazepine (CARBATROL) 200 MG 12 hr capsule Take 2 capsules in the morning and 3 capsules in the evening 150 capsule 0  SUMAtriptan (IMITREX) 50 MG tablet Take 1 tablet (50 mg total) by mouth every 2 (two) hours as needed for migraine. Take 1 tab at the onset of migraine, may repeat in 2 hours, if needed, max dose is 2 tabs/day. This is a 30-day prescription 10 tablet 5   No facility-administered medications prior to visit.    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   Migraine    Seizures  (HCC)     PAST SURGICAL HISTORY: Past Surgical History:  Procedure Laterality Date   EYE SURGERY  1970   FOOT SURGERY Left 11/2010   stress fracture    FAMILY HISTORY: Family History  Problem Relation Age of Onset   Breast cancer Mother    Prostate cancer Maternal Grandfather    Bladder Cancer Paternal Grandfather    Stroke Unknown        one grandmother    High blood pressure Unknown        one grandmother    Seizures Neg Hx     SOCIAL HISTORY: Social History   Socioeconomic History   Marital status: Married    Spouse name: Loraine Leriche   Number of children: 2   Years of education: college   Highest education level: Not on file  Occupational History    Employer: UNEMPLOYED  Tobacco Use   Smoking status: Never   Smokeless tobacco: Never  Substance and Sexual Activity   Alcohol use: Yes    Alcohol/week: 2.0 - 3.0 standard drinks of alcohol    Types: 2 - 3 drink(s) per week    Comment: mixed drinks weekly    Drug use: No   Sexual activity: Not on file  Other Topics Concern   Not on file  Social History Narrative   Patient has completed her masters in [employer] in an online course.    Patient works at [employer].    Patient has been married for 18 years and has 2 children. Ages 56 and 59   Patient drinks 3 cups of coffee daily.    Social Determinants of Health   Financial Resource Strain: Not on file  Food Insecurity: Not on file  Transportation Needs: Not on file  Physical Activity: Not on file  Stress: Not on file  Social Connections: Not on file  Intimate Partner Violence: Not on file   PHYSICAL EXAM  Vitals:   04/19/23 0921  BP: 128/76    There is no height or weight on file to calculate BMI.  Generalized: Well developed, in no acute distress   Neurological examination  Mentation: Alert oriented to time, place, history taking. Follows all commands speech and language fluent Cranial nerve II-XII: Pupils were equal round reactive  to light. Extraocular movements were full, visual field were full on confrontational test. Facial sensation and strength were normal. Head turning and shoulder shrug  were normal and symmetric. Motor: The motor testing reveals 5 over 5 strength of all 4 extremities. Good symmetric motor tone is noted throughout.  Sensory: Sensory testing is intact to soft touch on all 4 extremities. No evidence of extinction is noted.  Coordination: Cerebellar testing reveals good finger-nose-finger and heel-to-shin bilaterally.  Gait and station: Gait is normal.  Reflexes: Deep tendon reflexes are symmetric and normal bilaterally.   DIAGNOSTIC DATA (LABS, IMAGING, TESTING) - I reviewed patient records, labs, notes, testing and imaging myself where available.  Lab Results  Component Value Date   WBC 5.0 02/05/2021   HGB 14.7 02/05/2021   HCT 43.2 02/05/2021   MCV 91 02/05/2021   PLT 190 02/05/2021      Component Value Date/Time   NA 139 02/05/2021 0825   K 4.7 02/05/2021 0825   CL 98 02/05/2021 0825   CO2 28 02/05/2021 0825   GLUCOSE 79 02/05/2021 0825   GLUCOSE 119 (H) 11/14/2016 2050   BUN 18 02/05/2021 0825   CREATININE 0.70 02/05/2021 0825   CALCIUM 9.5 02/05/2021 0825   PROT 7.3 02/05/2021 0825   ALBUMIN 4.9 02/05/2021 0825   AST 21 02/05/2021 0825   ALT 17 02/05/2021 0825   ALKPHOS 104 02/05/2021 0825   BILITOT 0.2 02/05/2021 0825   GFRNONAA 98 06/18/2019 1034   GFRAA 112 06/18/2019 1034   No results found for: "CHOL", "HDL", "LDLCALC", "LDLDIRECT", "TRIG", "CHOLHDL" No results found for: "HGBA1C" No results found for: "VITAMINB12" No results found for: "TSH"  ASSESSMENT AND PLAN 57 y.o. 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 here with:  1.  History of generalized seizure disorder -Last seizure in 2014 -Continue Carbatrol 200 mg, 2 capsules AM, 3 capsules  PM -Check routine labs, vitamin D level  2.  Headaches -Continue Imitrex 50 mg as needed for acute headache -Follow-up in 1 year or sooner if needed, will see Dr. Teresa Coombs in 1 year to establish seizure care  Margie Ege, AGNP-C, DNP 04/19/2023, 9:47 AM [facility] [address], [address] [place], [place] [address] [phone]

## 2023-04-19 ENCOUNTER — Ambulatory Visit: Payer: Managed Care, Other (non HMO) | Admitting: Neurology

## 2023-04-19 ENCOUNTER — Encounter: Payer: Self-pay | Admitting: Neurology

## 2023-04-19 VITALS — BP 128/76

## 2023-04-19 DIAGNOSIS — G40309 Generalized idiopathic epilepsy and epileptic syndromes, not intractable, without status epilepticus: Secondary | ICD-10-CM

## 2023-04-19 DIAGNOSIS — G43809 Other migraine, not intractable, without status migrainosus: Secondary | ICD-10-CM | POA: Diagnosis not present

## 2023-04-19 MED ORDER — CARBAMAZEPINE ER 200 MG PO CP12: ORAL_CAPSULE | ORAL | 3 refills | Status: DC

## 2023-04-19 MED ORDER — SUMATRIPTAN SUCCINATE 50 MG PO TABS: 50.0000 mg | ORAL_TABLET | ORAL | 5 refills | Status: AC | PRN

## 2023-04-19 NOTE — Patient Instructions (Signed)
Great to see you today! We will check labs, continue generic Carbatrol I will let you see Dr. Teresa Coombs in 1 year to establish seizure care Please let me know if you need anything!  Thanks!!  Meds ordered this encounter  Medications   carbamazepine (CARBATROL) 200 MG 12 hr capsule    Sig: Take 2 capsules in the morning and 3 capsules in the evening    Dispense:  450 capsule    Refill:  3    Take 2 in the morning, take 3 at night   SUMAtriptan (IMITREX) 50 MG tablet    Sig: Take 1 tablet (50 mg total) by mouth every 2 (two) hours as needed for migraine. Take 1 tab at the onset of migraine, may repeat in 2 hours, if needed, max dose is 2 tabs/day. This is a 30-day prescription    Dispense:  10 tablet    Refill:  5    This is a 30 day prescription

## 2023-04-20 LAB — CBC WITH DIFFERENTIAL/PLATELET
EOS (ABSOLUTE): 0.1 10*3/uL (ref 0.0–0.4)
Hemoglobin: 14.7 g/dL (ref 11.1–15.9)
Immature Grans (Abs): 0 10*3/uL (ref 0.0–0.1)
MCH: 30 pg (ref 26.6–33.0)
MCHC: 32.7 g/dL (ref 31.5–35.7)
Monocytes: 7 %
Neutrophils: 51 %
Platelets: 254 10*3/uL (ref 150–450)
RDW: 12.5 % (ref 11.7–15.4)
WBC: 6.5 10*3/uL (ref 3.4–10.8)

## 2023-04-20 LAB — COMPREHENSIVE METABOLIC PANEL
ALT: 14 IU/L (ref 0–32)
AST: 18 IU/L (ref 0–40)
Albumin: 4.3 g/dL (ref 3.8–4.9)
Alkaline Phosphatase: 122 IU/L — ABNORMAL HIGH (ref 44–121)
BUN/Creatinine Ratio: 22 (ref 9–23)
BUN: 14 mg/dL (ref 6–24)
Bilirubin Total: <0.2 mg/dL (ref 0.0–1.2)
CO2: 26 mmol/L (ref 20–29)
Calcium: 9.2 mg/dL (ref 8.7–10.2)
Chloride: 102 mmol/L (ref 96–106)
Creatinine, Ser: 0.63 mg/dL (ref 0.57–1.00)
Potassium: 4.5 mmol/L (ref 3.5–5.2)
Total Protein: 7 g/dL (ref 6.0–8.5)
eGFR: 104 mL/min/{1.73_m2} (ref >59)

## 2023-04-20 LAB — VITAMIN D 25 HYDROXY (VIT D DEFICIENCY, FRACTURES): Vit D, 25-Hydroxy: 26.7 ng/mL — ABNORMAL LOW (ref 30.0–100.0)

## 2023-04-21 LAB — CBC WITH DIFFERENTIAL/PLATELET
Basophils Absolute: 0.1 10*3/uL (ref 0.0–0.2)
Basos: 1 %
Eos: 2 %
Hematocrit: 45 % (ref 34.0–46.6)
Immature Granulocytes: 1 %
Lymphocytes Absolute: 2.5 10*3/uL (ref 0.7–3.1)
Lymphs: 38 %
MCV: 92 fL (ref 79–97)
Monocytes Absolute: 0.5 10*3/uL (ref 0.1–0.9)
Neutrophils Absolute: 3.4 10*3/uL (ref 1.4–7.0)
RBC: 4.9 x10E6/uL (ref 3.77–5.28)

## 2023-04-21 LAB — COMPREHENSIVE METABOLIC PANEL
Albumin/Globulin Ratio: 1.6 (ref 1.2–2.2)
Globulin, Total: 2.7 g/dL (ref 1.5–4.5)
Glucose: 72 mg/dL (ref 70–99)
Sodium: 141 mmol/L (ref 134–144)

## 2023-04-21 LAB — CARBAMAZEPINE LEVEL, TOTAL: Carbamazepine (Tegretol), S: 7.8 ug/mL (ref 4.0–12.0)

## 2023-07-12 ENCOUNTER — Ambulatory Visit: Payer: BC Managed Care – PPO | Admitting: Psychiatry

## 2024-04-18 ENCOUNTER — Ambulatory Visit: Payer: Managed Care, Other (non HMO) | Admitting: Neurology

## 2024-05-01 ENCOUNTER — Other Ambulatory Visit: Payer: Self-pay | Admitting: Neurology

## 2024-10-29 ENCOUNTER — Telehealth: Payer: Self-pay | Admitting: Neurology

## 2024-10-29 NOTE — Telephone Encounter (Signed)
 Appointment r/s due to work conflict

## 2024-11-06 ENCOUNTER — Ambulatory Visit: Admitting: Neurology

## 2024-11-26 ENCOUNTER — Telehealth: Admitting: Family Medicine

## 2024-11-26 DIAGNOSIS — J069 Acute upper respiratory infection, unspecified: Secondary | ICD-10-CM

## 2024-11-26 MED ORDER — DOXYCYCLINE HYCLATE 100 MG PO TABS: 100.0000 mg | ORAL_TABLET | Freq: Two times a day (BID) | ORAL | 0 refills | Status: DC

## 2024-11-26 MED ORDER — DOXYCYCLINE HYCLATE 100 MG PO TABS: 100.0000 mg | ORAL_TABLET | Freq: Two times a day (BID) | ORAL | 0 refills | Status: AC

## 2024-11-26 NOTE — Patient Instructions (Signed)

## 2024-11-26 NOTE — Progress Notes (Signed)
 " Virtual Visit Consent   Amber Sanchez, you are scheduled for a virtual visit with a [facility] provider today. Just as with appointments in the office, your consent must be obtained to participate. Your consent will be active for this visit and any virtual visit you may have with one of our providers in the next 365 days. If you have a MyChart account, a copy of this consent can be sent to you electronically.  As this is a virtual visit, video technology does not allow for your provider to perform a traditional examination. This may limit your provider's ability to fully assess your condition. If your provider identifies any concerns that need to be evaluated in person or the need to arrange testing (such as labs, EKG, etc.), we will make arrangements to do so. Although advances in technology are sophisticated, we cannot ensure that it will always work on either your end or our end. If the connection with a video visit is poor, the visit may have to be switched to a telephone visit. With either a video or telephone visit, we are not always able to ensure that we have a secure connection.  By engaging in this virtual visit, you consent to the provision of healthcare and authorize for your insurance to be billed (if applicable) for the services provided during this visit. Depending on your insurance coverage, you may receive a charge related to this service.  I need to obtain your verbal consent now. Are you willing to proceed with your visit today? Amber Sanchez has provided verbal consent on 11/26/2024 for a virtual visit (video or telephone). Loa Lamp, FNP  Date: 11/26/2024 7:25 PM   Virtual Visit via Video Note   I, Loa Lamp, connected with  Amber Sanchez  ([id number], 11-09-1966) on 11/26/2024 at  7:15 PM EST by a video-enabled telemedicine application and verified that I am speaking with the correct person using two identifiers.  Location: Patient: Virtual Visit Location  Patient: Home Provider: Virtual Visit Location Provider: Home Office   I discussed the limitations of evaluation and management by telemedicine and the availability of in person appointments. The patient expressed understanding and agreed to proceed.    History of Present Illness: Amber Sanchez is a 58 y.o. who identifies as a female who was assigned female at birth, and is being seen today for bad cold since dec 12 then improved but never resolved in her head. Sx then worsened and she has head congestion, no cough, dry throat, post nasal drainage, sinus pressure, headache, fever a few night ago No wheezing or sob, No energy. Amber Sanchez  HPI: HPI  Problems:  Patient Active Problem List   Diagnosis Date Noted   Migraine 10/18/2016   Generalized convulsive epilepsy (HCC) 11/01/2013    Allergies: Allergies[1] Medications: Current Medications[2]  Observations/Objective: Patient is well-developed, well-nourished in no acute distress.  Resting comfortably  at home.  Head is normocephalic, atraumatic.  No labored breathing.  Speech is clear and coherent with logical content.  Patient is alert and oriented at baseline.    Assessment and Plan: 1. Upper respiratory tract infection, unspecified type (Primary)  Increase fluids, humidifier at night, tylenol  or ibuprofen , otc dayquil, etc. UC as needed.   Follow Up Instructions: I discussed the assessment and treatment plan with the patient. The patient was provided an opportunity to ask questions and all were answered. The patient agreed with the plan and demonstrated an understanding of the instructions.  A copy  of instructions were sent to the patient via MyChart unless otherwise noted below.     The patient was advised to call back or seek an in-person evaluation if the symptoms worsen or if the condition fails to improve as anticipated.    Charese Abundis, FNP      [1]  Allergies Allergen Reactions   Penicillins     Childhood,  unknown Has patient had a PCN reaction causing immediate rash, facial/tongue/throat swelling, SOB or lightheadedness with hypotension: unknown Has patient had a PCN reaction causing severe rash involving mucus membranes or skin necrosis: unknown Has patient had a PCN reaction that required hospitalization: unknown Has patient had a PCN reaction occurring within the last 10 years: no If all of the above answers are NO, then may proceed with Cephalosporin use.  [2]  Current Outpatient Medications:    carbamazepine  (CARBATROL ) 200 MG 12 hr capsule, TAKE 2 CAPSULES IN THE MORNING AND 3 CAPSULES IN THE EVENING, Disp: 450 capsule, Rfl: 3   doxycycline  (VIBRA -TABS) 100 MG tablet, Take 1 tablet (100 mg total) by mouth 2 (two) times daily for 10 days., Disp: 20 tablet, Rfl: 0   naproxen sodium (ANAPROX) 220 MG tablet, Take 440 mg by mouth 2 (two) times daily with a meal., Disp: , Rfl:    SUMAtriptan  (IMITREX ) 50 MG tablet, Take 1 tablet (50 mg total) by mouth every 2 (two) hours as needed for migraine. Take 1 tab at the onset of migraine, may repeat in 2 hours, if needed, max dose is 2 tabs/day. This is a 30-day prescription, Disp: 10 tablet, Rfl: 5  "

## 2025-05-30 ENCOUNTER — Ambulatory Visit: Admitting: Neurology
